# Patient Record
Sex: Male | Born: 1980 | Race: Black or African American | Hispanic: No | Marital: Single | Smoking: Current every day smoker
Health system: Southern US, Community
[De-identification: ages and names within clinical notes are randomized; demographics above are authoritative.]

## PROBLEM LIST (undated history)

## (undated) DIAGNOSIS — F32A Depression, unspecified: Secondary | ICD-10-CM

## (undated) DIAGNOSIS — F319 Bipolar disorder, unspecified: Secondary | ICD-10-CM

## (undated) DIAGNOSIS — R569 Unspecified convulsions: Secondary | ICD-10-CM

## (undated) DIAGNOSIS — F209 Schizophrenia, unspecified: Secondary | ICD-10-CM

## (undated) DIAGNOSIS — F329 Major depressive disorder, single episode, unspecified: Secondary | ICD-10-CM

## (undated) DIAGNOSIS — I10 Essential (primary) hypertension: Secondary | ICD-10-CM

---
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
## 2004-04-09 ENCOUNTER — Emergency Department (HOSPITAL_COMMUNITY): Admission: EM | Admit: 2004-04-09 | Discharge: 2004-04-09 | Payer: Self-pay | Admitting: Family Medicine

## 2006-04-29 ENCOUNTER — Emergency Department (HOSPITAL_COMMUNITY): Admission: EM | Admit: 2006-04-29 | Discharge: 2006-04-29 | Payer: Self-pay | Admitting: Emergency Medicine

## 2006-04-30 ENCOUNTER — Emergency Department (HOSPITAL_COMMUNITY): Admission: EM | Admit: 2006-04-30 | Discharge: 2006-04-30 | Payer: Self-pay | Admitting: Emergency Medicine

## 2006-10-02 ENCOUNTER — Emergency Department (HOSPITAL_COMMUNITY): Admission: EM | Admit: 2006-10-02 | Discharge: 2006-10-02 | Payer: Self-pay | Admitting: Emergency Medicine

## 2007-10-01 ENCOUNTER — Emergency Department (HOSPITAL_COMMUNITY): Admission: EM | Admit: 2007-10-01 | Discharge: 2007-10-01 | Payer: Self-pay | Admitting: Emergency Medicine

## 2008-10-28 ENCOUNTER — Emergency Department (HOSPITAL_COMMUNITY): Admission: EM | Admit: 2008-10-28 | Discharge: 2008-10-28 | Payer: Self-pay | Admitting: Emergency Medicine

## 2008-11-26 ENCOUNTER — Emergency Department (HOSPITAL_COMMUNITY): Admission: EM | Admit: 2008-11-26 | Discharge: 2008-11-26 | Payer: Self-pay | Admitting: Emergency Medicine

## 2009-06-02 ENCOUNTER — Emergency Department (HOSPITAL_COMMUNITY): Admission: EM | Admit: 2009-06-02 | Discharge: 2009-06-02 | Payer: Self-pay | Admitting: Family Medicine

## 2009-08-05 ENCOUNTER — Emergency Department (HOSPITAL_COMMUNITY): Admission: EM | Admit: 2009-08-05 | Discharge: 2009-08-05 | Payer: Self-pay | Admitting: Emergency Medicine

## 2010-04-17 LAB — CBC
HCT: 38.5 % — ABNORMAL LOW (ref 39.0–52.0)
Hemoglobin: 13.3 g/dL (ref 13.0–17.0)
MCHC: 34.5 g/dL (ref 30.0–36.0)
MCV: 88.3 fL (ref 78.0–100.0)
RDW: 11.9 % (ref 11.5–15.5)

## 2010-04-17 LAB — DIFFERENTIAL
Basophils Absolute: 0.1 10*3/uL (ref 0.0–0.1)
Basophils Relative: 1 % (ref 0–1)
Eosinophils Absolute: 0.1 10*3/uL (ref 0.0–0.7)
Eosinophils Relative: 2 % (ref 0–5)
Monocytes Absolute: 0.4 10*3/uL (ref 0.1–1.0)
Neutro Abs: 3.5 10*3/uL (ref 1.7–7.7)

## 2010-04-17 LAB — URINALYSIS, ROUTINE W REFLEX MICROSCOPIC
Bilirubin Urine: NEGATIVE
Glucose, UA: NEGATIVE mg/dL
Hgb urine dipstick: NEGATIVE
Nitrite: NEGATIVE
Protein, ur: NEGATIVE mg/dL
Specific Gravity, Urine: 1.025 (ref 1.005–1.030)
Urobilinogen, UA: 0.2 mg/dL (ref 0.0–1.0)
pH: 6.5 (ref 5.0–8.0)

## 2010-04-17 LAB — RAPID URINE DRUG SCREEN, HOSP PERFORMED
Amphetamines: NOT DETECTED
Barbiturates: NOT DETECTED
Benzodiazepines: NOT DETECTED
Cocaine: NOT DETECTED
Opiates: NOT DETECTED
Tetrahydrocannabinol: POSITIVE — AB

## 2010-04-17 LAB — BASIC METABOLIC PANEL
CO2: 25 mEq/L (ref 19–32)
Calcium: 8.9 mg/dL (ref 8.4–10.5)
Chloride: 105 mEq/L (ref 96–112)
Glucose, Bld: 95 mg/dL (ref 70–99)
Sodium: 137 mEq/L (ref 135–145)

## 2010-04-17 LAB — URINE MICROSCOPIC-ADD ON

## 2010-04-18 LAB — RAPID URINE DRUG SCREEN, HOSP PERFORMED: Tetrahydrocannabinol: POSITIVE — AB

## 2010-10-14 LAB — CBC
HCT: 38.8 — ABNORMAL LOW
Hemoglobin: 12.7 — ABNORMAL LOW
MCV: 86.6
Platelets: 172
RDW: 14.1

## 2010-10-14 LAB — RAPID URINE DRUG SCREEN, HOSP PERFORMED
Amphetamines: NOT DETECTED
Barbiturates: NOT DETECTED
Benzodiazepines: NOT DETECTED
Cocaine: NOT DETECTED
Opiates: NOT DETECTED

## 2010-10-14 LAB — COMPREHENSIVE METABOLIC PANEL
Albumin: 4
Alkaline Phosphatase: 56
BUN: 13
Chloride: 106
Creatinine, Ser: 0.9
Glucose, Bld: 87
Potassium: 3.6
Total Bilirubin: 0.8
Total Protein: 6.4

## 2010-10-14 LAB — DIFFERENTIAL
Basophils Absolute: 0
Basophils Relative: 1
Lymphocytes Relative: 36
Monocytes Absolute: 0.4
Neutro Abs: 3.2
Neutrophils Relative %: 55

## 2010-10-14 LAB — POCT I-STAT, CHEM 8
BUN: 15
Calcium, Ion: 1.16
Chloride: 106
HCT: 40
Potassium: 3.6
Sodium: 140

## 2010-10-14 LAB — URINALYSIS, ROUTINE W REFLEX MICROSCOPIC
Bilirubin Urine: NEGATIVE
Glucose, UA: NEGATIVE
Hgb urine dipstick: NEGATIVE
Protein, ur: NEGATIVE
Urobilinogen, UA: 0.2

## 2010-11-24 ENCOUNTER — Emergency Department (HOSPITAL_COMMUNITY)
Admission: EM | Admit: 2010-11-24 | Discharge: 2010-11-25 | Disposition: A | Discharge status: Home / Self Care | Payer: Medicaid Other | Attending: Emergency Medicine | Admitting: Emergency Medicine

## 2010-11-24 ENCOUNTER — Emergency Department (HOSPITAL_COMMUNITY)
Admission: EM | Admit: 2010-11-24 | Discharge: 2010-11-24 | Discharge status: Against Medical Advice | Payer: Self-pay | Attending: Emergency Medicine | Admitting: Emergency Medicine

## 2010-11-24 ENCOUNTER — Encounter: Payer: Self-pay | Admitting: *Deleted

## 2010-11-24 DIAGNOSIS — F209 Schizophrenia, unspecified: Secondary | ICD-10-CM | POA: Insufficient documentation

## 2010-11-24 DIAGNOSIS — Z79899 Other long term (current) drug therapy: Secondary | ICD-10-CM | POA: Insufficient documentation

## 2010-11-24 DIAGNOSIS — F17200 Nicotine dependence, unspecified, uncomplicated: Secondary | ICD-10-CM | POA: Insufficient documentation

## 2010-11-24 DIAGNOSIS — Z0389 Encounter for observation for other suspected diseases and conditions ruled out: Secondary | ICD-10-CM | POA: Insufficient documentation

## 2010-11-24 DIAGNOSIS — R112 Nausea with vomiting, unspecified: Secondary | ICD-10-CM

## 2010-11-24 DIAGNOSIS — R109 Unspecified abdominal pain: Secondary | ICD-10-CM | POA: Insufficient documentation

## 2010-11-24 HISTORY — DX: Unspecified convulsions: R56.9

## 2010-11-24 NOTE — ED Notes (Signed)
Pt arrives via EMS c/o nausea, vomiting, abdominal pain, chills and fever. No fever at this time. Patient states that he believes he has roach in his ear because he saw a roach crawling in his bed Friday night. Nothing visualized in right ear at this time. He also states that he has not been able to hold anything on his stomach for 2 days. Patient complaining of epigastric pain, patient states that he believes that he ate maggots by mistake approximately 1 month ago. Patients pain is a aching pain 10/10. States pain is "all over my body." Patient states that he has vomited 7 x today and 3 x yesterday. Patient states that the vomit is the same. Small amount (50ml) of vomit in pt emesis bag. Patient states that he has also had to use the restroom more frequently than normal as well. Unable to tell hox much more frequently.Patient states that he is having sensitivity to light as well.

## 2010-11-25 ENCOUNTER — Encounter (HOSPITAL_COMMUNITY): Payer: Self-pay | Admitting: Emergency Medicine

## 2010-11-25 LAB — POCT I-STAT, CHEM 8
BUN: 12 mg/dL (ref 6–23)
Calcium, Ion: 1.16 mmol/L (ref 1.12–1.32)
Chloride: 104 mEq/L (ref 96–112)
HCT: 42 % (ref 39.0–52.0)
Potassium: 3.6 mEq/L (ref 3.5–5.1)
Sodium: 140 mEq/L (ref 135–145)

## 2010-11-25 LAB — OCCULT BLOOD, POC DEVICE: Fecal Occult Bld: NEGATIVE

## 2010-11-25 LAB — LITHIUM LEVEL: Lithium Lvl: <0.25 mEq/L — ABNORMAL LOW (ref 0.80–1.40)

## 2010-11-25 MED ORDER — ONDANSETRON HCL 4 MG PO TABS: 4.0000 mg | ORAL_TABLET | Freq: Four times a day (QID) | ORAL | Status: AC

## 2010-11-25 MED ORDER — PANTOPRAZOLE SODIUM 40 MG PO TBEC
40.0000 mg | DELAYED_RELEASE_TABLET | Freq: Once | ORAL | Status: AC
  Administered 2010-11-25: 40 mg via ORAL

## 2010-11-25 MED ORDER — ONDANSETRON 8 MG PO TBDP
8.0000 mg | ORAL_TABLET | Freq: Once | ORAL | Status: AC
  Administered 2010-11-25: 8 mg via ORAL

## 2010-11-25 NOTE — ED Provider Notes (Signed)
History     CSN: [id number] Arrival date & time: 11/24/2010  9:38 PM   First MD Initiated Contact with Patient 11/24/10 2139      Chief Complaint  Patient presents with  . Nausea   HPI History provided by the patient. Patient presents with complaints of nausea and vomiting that began yesterday. Reports also having blood in vomit at times.  He has not been able to eat much and feels nausea with drinking fluids.  Pt states he has hx of schizophrenia for which he takes lithium. Patient denies change in dosage has been taking as prescribed. Denies depression, SI/HI, paranoia, auditory or visual hallucinations. Patient denies any alcohol or drug use.    Past Medical History  Diagnosis Date  . Seizures     History reviewed. No pertinent past surgical history.  History reviewed. No pertinent family history.  History  Substance Use Topics  . Smoking status: Current Everyday Smoker -- 1.0 packs/day for 5 years    Types: Cigarettes  . Smokeless tobacco: Not on file  . Alcohol Use: No      Review of Systems  Constitutional: Negative for fever and chills.  Gastrointestinal: Positive for nausea, vomiting and abdominal pain.  Psychiatric/Behavioral: Negative for suicidal ideas and hallucinations. The patient is not nervous/anxious.   All other systems reviewed and are negative.    Allergies  Review of patient's allergies indicates no known allergies.  Home Medications   Current Outpatient Rx  Name Route Sig Dispense Refill  . LITHIUM CARBONATE 150 MG PO CAPS Oral Take 150 mg by mouth 3 (three) times daily with meals.        BP 119/16  Pulse 77  Temp(Src) 98.5 F (36.9 C) (Oral)  Resp 18  Ht 5\' 9"  (1.753 m)  Wt 155 lb (70.308 kg)  BMI 22.89 kg/m2  SpO2 100%  Physical Exam  Nursing note and vitals reviewed. Constitutional: He is oriented to person, place, and time. He appears well-developed and well-nourished. No distress.  HENT:  Head: Normocephalic and  atraumatic.  Eyes: Conjunctivae are normal.  Cardiovascular: Normal rate.   No murmur heard. Pulmonary/Chest: Effort normal and breath sounds normal. He has no wheezes. He has no rales.  Abdominal: Soft. Bowel sounds are normal. There is no tenderness. There is no rebound and no guarding.  Genitourinary: Rectum normal. Rectal exam shows no tenderness. Guaiac negative stool.  Neurological: He is alert and oriented to person, place, and time.  Skin: Skin is warm.  Psychiatric: He has a normal mood and affect. His behavior is normal. Judgment and thought content normal.    ED Course  Procedures (including critical care time)   Labs Reviewed  POCT I-STAT, CHEM 8  OCCULT BLOOD, POC DEVICE  LITHIUM LEVEL  I-STAT, CHEM 8  OCCULT BLOOD X 1 CARD TO LAB, STOOL   Results for orders placed during the hospital encounter of 11/24/10  POCT I-STAT, CHEM 8      Component Value Range   Sodium 140  135 - 145 (mEq/L)   Potassium 3.6  3.5 - 5.1 (mEq/L)   Chloride 104  96 - 112 (mEq/L)   BUN 12  6 - 23 (mg/dL)   Creatinine, Ser 4.09  0.50 - 1.35 (mg/dL)   Glucose, Bld 85  70 - 99 (mg/dL)   Calcium, Ion 8.11  9.14 - 1.32 (mmol/L)   TCO2 23  0 - 100 (mmol/L)   Hemoglobin 14.3  13.0 - 17.0 (g/dL)   HCT 42.0  39.0 - 52.0 (%)  OCCULT BLOOD, POC DEVICE      Component Value Range   Fecal Occult Bld NEGATIVE       1. Nausea & vomiting       MDM  Patient seen and evaluated. Patient in no acute distress. She was soft nontender abdomen.  Patient feeling better after medications and requesting something to eat and drink. Will start with by mouth fluid trial.  Tolerating by mouth fluids and food at this time. Will discharge home. Patient was notified that his lithium level is low and to continue his medications as prescribed.      Angus Seller, [place] 11/26/10 [phone]

## 2010-11-25 NOTE — Discharge Instructions (Signed)
Please continue to take her lithium as prescribed. He is followup to primary care provider and psychiatrist.  Nausea and Vomiting Nausea means you feel sick to your stomach. Throwing up (vomiting) is a reflex where stomach contents come out of your mouth. HOME CARE   Take medicine as told by your doctor.   Do not force yourself to eat. However, you do need to drink fluids.   If you feel like eating, eat a normal diet as told by your doctor.   Eat rice, wheat, potatoes, bread, lean meats, yogurt, fruits, and vegetables.   Avoid high-fat foods.   Drink enough fluids to keep your pee (urine) clear or pale yellow.   Ask your doctor how to replace body fluid losses (rehydrate). Signs of body fluid loss (dehydration) include:   Feeling very thirsty.   Dry lips and mouth.   Feeling dizzy.   Dark pee.   Peeing less than normal.   Feeling confused.   Fast breathing or heart rate.  GET HELP RIGHT AWAY IF:   You have blood in your throw up.   You have black or bloody poop (stool).   You have a bad headache or stiff neck.   You feel confused.   You have bad belly (abdominal) pain.   You have chest pain or trouble breathing.   You do not pee at least once every 8 hours.   You have cold, clammy skin.   You keep throwing up after 24 to 48 hours.   You have a fever.  MAKE SURE YOU:   Understand these instructions.   Will watch your condition.   Will get help right away if you are not doing well or get worse.  Document Released: 06/18/2007 Document Revised: 09/11/2010 Document Reviewed: 05/31/2010 [facility] Patient Information 2012 [place], [place].

## 2010-11-27 NOTE — ED Provider Notes (Signed)
Medical screening examination/treatment/procedure(s) were performed by non-physician practitioner and as supervising physician I was immediately available for consultation/collaboration.  Flint Melter, MD 11/27/10 [phone]

## 2011-07-06 ENCOUNTER — Emergency Department (HOSPITAL_COMMUNITY)
Admission: EM | Admit: 2011-07-06 | Discharge: 2011-07-06 | Disposition: A | Discharge status: Home / Self Care | Payer: Medicaid Other | Attending: Emergency Medicine | Admitting: Emergency Medicine

## 2011-07-06 ENCOUNTER — Emergency Department (HOSPITAL_COMMUNITY): Payer: Medicaid Other

## 2011-07-06 ENCOUNTER — Encounter (HOSPITAL_COMMUNITY): Payer: Self-pay | Admitting: Emergency Medicine

## 2011-07-06 DIAGNOSIS — J069 Acute upper respiratory infection, unspecified: Secondary | ICD-10-CM | POA: Insufficient documentation

## 2011-07-06 DIAGNOSIS — R569 Unspecified convulsions: Secondary | ICD-10-CM | POA: Insufficient documentation

## 2011-07-06 DIAGNOSIS — R05 Cough: Secondary | ICD-10-CM | POA: Insufficient documentation

## 2011-07-06 DIAGNOSIS — B349 Viral infection, unspecified: Secondary | ICD-10-CM

## 2011-07-06 DIAGNOSIS — B9789 Other viral agents as the cause of diseases classified elsewhere: Secondary | ICD-10-CM | POA: Insufficient documentation

## 2011-07-06 DIAGNOSIS — R112 Nausea with vomiting, unspecified: Secondary | ICD-10-CM | POA: Insufficient documentation

## 2011-07-06 DIAGNOSIS — F17200 Nicotine dependence, unspecified, uncomplicated: Secondary | ICD-10-CM | POA: Insufficient documentation

## 2011-07-06 DIAGNOSIS — J029 Acute pharyngitis, unspecified: Secondary | ICD-10-CM | POA: Insufficient documentation

## 2011-07-06 DIAGNOSIS — IMO0001 Reserved for inherently not codable concepts without codable children: Secondary | ICD-10-CM | POA: Insufficient documentation

## 2011-07-06 DIAGNOSIS — R059 Cough, unspecified: Secondary | ICD-10-CM | POA: Insufficient documentation

## 2011-07-06 DIAGNOSIS — R079 Chest pain, unspecified: Secondary | ICD-10-CM | POA: Insufficient documentation

## 2011-07-06 DIAGNOSIS — R509 Fever, unspecified: Secondary | ICD-10-CM | POA: Insufficient documentation

## 2011-07-06 LAB — RAPID STREP SCREEN (MED CTR MEBANE ONLY): Streptococcus, Group A Screen (Direct): NEGATIVE

## 2011-07-06 MED ORDER — ACETAMINOPHEN 325 MG PO TABS
650.0000 mg | ORAL_TABLET | Freq: Once | ORAL | Status: AC
  Administered 2011-07-06: 650 mg via ORAL
  Filled 2011-07-06: qty 2

## 2011-07-06 MED ORDER — KETOROLAC TROMETHAMINE 60 MG/2ML IM SOLN
60.0000 mg | Freq: Once | INTRAMUSCULAR | Status: AC
  Administered 2011-07-06: 60 mg via INTRAMUSCULAR
  Filled 2011-07-06: qty 2

## 2011-07-06 NOTE — ED Notes (Signed)
Patient transported to X-ray 

## 2011-07-06 NOTE — ED Provider Notes (Signed)
History  This chart was scribed for Erik Numbers, MD by Bennett Scrape. This patient was seen in room TR05C/TR05C and the patient's care was started at 4:49PM.  CSN: [id number]  Arrival date & time 07/06/11  1616   First MD Initiated Contact with Patient 07/06/11 1649      Chief Complaint  Patient presents with  . Sore Throat  . Cough  . Chest Pain    The history is provided by the patient. No language interpreter was used.    Erik Vargas is a 31 y.o. male who presents to the Emergency Department complaining of one week of gradual onset, gradually worsening, constant productive cough of mucus with associated  chest tightness, myalgias, nausea, emesis, chills and fever of 101.8. The symptoms are better with rest and aggravated by exertion. He reports taking ibuprofen at home with mild improvement in his symptoms. His last dose of ibuprofen was last night. He has not seen a MD for the symptoms prior to today. He reports that his Grandfather was recently diagnosed with PNA. He denies urinary symptoms, diarrhea and abdominal pain as associated symptoms. He has a h/o seizures. He is a current everyday smoker but denies alcohol use.  Past Medical History  Diagnosis Date  . Seizures     History reviewed. No pertinent past surgical history.  No family history on file.  History  Substance Use Topics  . Smoking status: Current Everyday Smoker -- 1.0 packs/day for 5 years    Types: Cigarettes  . Smokeless tobacco: Not on file  . Alcohol Use: No      Review of Systems  Constitutional: Positive for fever and chills.  HENT: Positive for sore throat. Negative for neck pain.   Respiratory: Positive for cough and chest tightness. Negative for shortness of breath.   Gastrointestinal: Positive for nausea and vomiting. Negative for abdominal pain.  Genitourinary: Negative for dysuria and hematuria.  All other systems reviewed and are negative.    Allergies  Review of patient's  allergies indicates no known allergies.  Home Medications   Current Outpatient Rx  Name Route Sig Dispense Refill  . PRESCRIPTION MEDICATION Intramuscular Inject 1 application into the muscle once a week. Lithium injection      Triage Vitals: BP 128/74  Pulse 82  Temp 101.8 F (38.8 C) (Oral)  Resp 14  SpO2 100%  Physical Exam  Nursing note and vitals reviewed.  GEN: Well-developed, well-nourished male in no distress HEENT: Atraumatic, normocephalic. Oropharynx is erythematous without exudate EYES: PERRLA BL, no scleral icterus. NECK: Trachea midline, no meningismus CV: regular rate and rhythm. No murmurs, rubs, or gallops PULM: No respiratory distress.  No crackles, wheezes, or rales. Neuro: A and O x 3 MSK: Patient moves all 4 extremities symmetrically, no deformity, edema, or injury noted Skin: No rashes petechiae, purpura, or jaundice Psych: no abnormality of mood  ED Course  Procedures (including critical care time)  DIAGNOSTIC STUDIES: Oxygen Saturation is 100% on room air, normal by my interpretation.    COORDINATION OF CARE: 5:18PM-Informed pt of negative strep test. Discussed treatment plan which includes chest x-ray with pt and pt agreed to plan. Pt turned down nausea medications.  6:13PM-Pt rechecked and feels better. Informed pt of negative radiology report. Discussed discharge plan with pt and pt agreed to plan.   Labs Reviewed  RAPID STREP SCREEN   Dg Chest 2 View  07/06/2011  *RADIOLOGY REPORT*  Clinical Data: Chest pain.  CHEST - 2 VIEW  Comparison:  None  Findings: The cardiac silhouette, mediastinal and hilar contours are within normal limits.  The lungs are clear.  No pleural effusion.  The bony thorax is intact.  IMPRESSION: No acute cardiopulmonary findings.  Original Report Authenticated By: P. Loralie Champagne, M.D.     1. URI (upper respiratory infection)   2. Viral syndrome       MDM  Patient was evaluated by myself. Prior to my exam  patient had had a rapid strep screen sent. This was negative. Patient had erythema without exudate on my exam. He did complain of cough and was febrile here with recent exposure to a family member with pneumonia. Patient reported symptoms x1 week. He was otherwise hemodynamically stable. Given his myalgias he was given a shot of Toradol IM. Chest x-ray was performed and showed no pneumonia. Patient denied nausea here. Following therapy patient had vital signs within normal limits. He was discharged home and advised to use over-the-counter medications for symptom control. He was also encouraged to drink plenty of fluids. He can followup with her regular Dr. as needed.      I personally performed the services described in this documentation, which was scribed in my presence. The recorded information has been reviewed and considered.      Erik Numbers, MD 07/06/11 [phone]

## 2011-07-06 NOTE — ED Notes (Addendum)
C/o sore throat, chills, and non-productive cough x 1 week.  Reports chest pain since Thursday.

## 2011-07-06 NOTE — Discharge Instructions (Signed)
Upper Respiratory Infection, Adult An upper respiratory infection (URI) is also sometimes known as the common cold. The upper respiratory tract includes the nose, sinuses, throat, trachea, and bronchi. Bronchi are the airways leading to the lungs. Most people improve within 1 week, but symptoms can last up to 2 weeks. A residual cough may last even longer.  CAUSES Many different viruses can infect the tissues lining the upper respiratory tract. The tissues become irritated and inflamed and often become very moist. Mucus production is also common. A cold is contagious. You can easily spread the virus to others by oral contact. This includes kissing, sharing a glass, coughing, or sneezing. Touching your mouth or nose and then touching a surface, which is then touched by another person, can also spread the virus. SYMPTOMS  Symptoms typically develop 1 to 3 days after you come in contact with a cold virus. Symptoms vary from person to person. They may include:  Runny nose.   Sneezing.   Nasal congestion.   Sinus irritation.   Sore throat.   Loss of voice (laryngitis).   Cough.   Fatigue.   Muscle aches.   Loss of appetite.   Headache.   Low-grade fever.  DIAGNOSIS  You might diagnose your own cold based on familiar symptoms, since most people get a cold 2 to 3 times a year. Your caregiver can confirm this based on your exam. Most importantly, your caregiver can check that your symptoms are not due to another disease such as strep throat, sinusitis, pneumonia, asthma, or epiglottitis. Blood tests, throat tests, and X-rays are not necessary to diagnose a common cold, but they may sometimes be helpful in excluding other more serious diseases. Your caregiver will decide if any further tests are required. RISKS AND COMPLICATIONS  You may be at risk for a more severe case of the common cold if you smoke cigarettes, have chronic heart disease (such as heart failure) or lung disease (such as  asthma), or if you have a weakened immune system. The very young and very old are also at risk for more serious infections. Bacterial sinusitis, middle ear infections, and bacterial pneumonia can complicate the common cold. The common cold can worsen asthma and chronic obstructive pulmonary disease (COPD). Sometimes, these complications can require emergency medical care and may be life-threatening. PREVENTION  The best way to protect against getting a cold is to practice good hygiene. Avoid oral or hand contact with people with cold symptoms. Wash your hands often if contact occurs. There is no clear evidence that vitamin C, vitamin E, echinacea, or exercise reduces the chance of developing a cold. However, it is always recommended to get plenty of rest and practice good nutrition. TREATMENT  Treatment is directed at relieving symptoms. There is no cure. Antibiotics are not effective, because the infection is caused by a virus, not by bacteria. Treatment may include:  Increased fluid intake. Sports drinks offer valuable electrolytes, sugars, and fluids.   Breathing heated mist or steam (vaporizer or shower).   Eating chicken soup or other clear broths, and maintaining good nutrition.   Getting plenty of rest.   Using gargles or lozenges for comfort.   Controlling fevers with ibuprofen or acetaminophen as directed by your caregiver.   Increasing usage of your inhaler if you have asthma.  Zinc gel and zinc lozenges, taken in the first 24 hours of the common cold, can shorten the duration and lessen the severity of symptoms. Pain medicines may help with fever, muscle   aches, and throat pain. A variety of non-prescription medicines are available to treat congestion and runny nose. Your caregiver can make recommendations and may suggest nasal or lung inhalers for other symptoms.  HOME CARE INSTRUCTIONS   Only take over-the-counter or prescription medicines for pain, discomfort, or fever as directed  by your caregiver.   Use a warm mist humidifier or inhale steam from a shower to increase air moisture. This may keep secretions moist and make it easier to breathe.   Drink enough water and fluids to keep your urine clear or pale yellow.   Rest as needed.   Return to work when your temperature has returned to normal or as your caregiver advises. You may need to stay home longer to avoid infecting others. You can also use a face mask and careful hand washing to prevent spread of the virus.  SEEK MEDICAL CARE IF:   After the first few days, you feel you are getting worse rather than better.   You need your caregiver's advice about medicines to control symptoms.   You develop chills, worsening shortness of breath, or brown or red sputum. These may be signs of pneumonia.   You develop yellow or brown nasal discharge or pain in the face, especially when you bend forward. These may be signs of sinusitis.   You develop a fever, swollen neck glands, pain with swallowing, or white areas in the back of your throat. These may be signs of strep throat.  SEEK IMMEDIATE MEDICAL CARE IF:   You have a fever.   You develop severe or persistent headache, ear pain, sinus pain, or chest pain.   You develop wheezing, a prolonged cough, cough up blood, or have a change in your usual mucus (if you have chronic lung disease).   You develop sore muscles or a stiff neck.  Document Released: 06/25/2000 Document Revised: 12/19/2010 Document Reviewed: 05/03/2010 [facility] Patient Information 2012 [place], [place].Viral Infections A viral infection can be caused by different types of viruses.Most viral infections are not serious and resolve on their own. However, some infections may cause severe symptoms and may lead to further complications. SYMPTOMS Viruses can frequently cause:  Minor sore throat.   Aches and pains.   Headaches.   Runny nose.   Different types of rashes.   Watery eyes.    Tiredness.   Cough.   Loss of appetite.   Gastrointestinal infections, resulting in nausea, vomiting, and diarrhea.  These symptoms do not respond to antibiotics because the infection is not caused by bacteria. However, you might catch a bacterial infection following the viral infection. This is sometimes called a "superinfection." Symptoms of such a bacterial infection may include:  Worsening sore throat with pus and difficulty swallowing.   Swollen neck glands.   Chills and a high or persistent fever.   Severe headache.   Tenderness over the sinuses.   Persistent overall ill feeling (malaise), muscle aches, and tiredness (fatigue).   Persistent cough.   Yellow, green, or brown mucus production with coughing.  HOME CARE INSTRUCTIONS   Only take over-the-counter or prescription medicines for pain, discomfort, diarrhea, or fever as directed by your caregiver.   Drink enough water and fluids to keep your urine clear or pale yellow. Sports drinks can provide valuable electrolytes, sugars, and hydration.   Get plenty of rest and maintain proper nutrition. Soups and broths with crackers or rice are fine.  SEEK IMMEDIATE MEDICAL CARE IF:   You have severe headaches, shortness of  breath, chest pain, neck pain, or an unusual rash.   You have uncontrolled vomiting, diarrhea, or you are unable to keep down fluids.   You or your child has an oral temperature above 102 F (38.9 C), not controlled by medicine.   Your baby is older than 3 months with a rectal temperature of 102 F (38.9 C) or higher.   Your baby is 60 months old or younger with a rectal temperature of 100.4 F (38 C) or higher.  MAKE SURE YOU:   Understand these instructions.   Will watch your condition.   Will get help right away if you are not doing well or get worse.  Document Released: 10/09/2004 Document Revised: 12/19/2010 Document Reviewed: 05/06/2010 [facility] Patient Information 2012 [place],  [place].

## 2012-02-01 ENCOUNTER — Emergency Department (HOSPITAL_COMMUNITY)
Admission: EM | Admit: 2012-02-01 | Discharge: 2012-02-03 | Disposition: A | Discharge status: Home / Self Care | Payer: Medicaid Other | Attending: Emergency Medicine | Admitting: Emergency Medicine

## 2012-02-01 ENCOUNTER — Encounter (HOSPITAL_COMMUNITY): Payer: Self-pay | Admitting: Emergency Medicine

## 2012-02-01 DIAGNOSIS — G40909 Epilepsy, unspecified, not intractable, without status epilepticus: Secondary | ICD-10-CM | POA: Insufficient documentation

## 2012-02-01 DIAGNOSIS — F3289 Other specified depressive episodes: Secondary | ICD-10-CM | POA: Insufficient documentation

## 2012-02-01 DIAGNOSIS — F259 Schizoaffective disorder, unspecified: Secondary | ICD-10-CM

## 2012-02-01 DIAGNOSIS — R45851 Suicidal ideations: Secondary | ICD-10-CM

## 2012-02-01 DIAGNOSIS — F209 Schizophrenia, unspecified: Secondary | ICD-10-CM | POA: Insufficient documentation

## 2012-02-01 DIAGNOSIS — Z79899 Other long term (current) drug therapy: Secondary | ICD-10-CM | POA: Insufficient documentation

## 2012-02-01 DIAGNOSIS — R110 Nausea: Secondary | ICD-10-CM | POA: Insufficient documentation

## 2012-02-01 DIAGNOSIS — F329 Major depressive disorder, single episode, unspecified: Secondary | ICD-10-CM | POA: Insufficient documentation

## 2012-02-01 DIAGNOSIS — F17200 Nicotine dependence, unspecified, uncomplicated: Secondary | ICD-10-CM | POA: Insufficient documentation

## 2012-02-01 HISTORY — DX: Depression, unspecified: F32.A

## 2012-02-01 HISTORY — DX: Major depressive disorder, single episode, unspecified: F32.9

## 2012-02-01 HISTORY — DX: Schizophrenia, unspecified: F20.9

## 2012-02-01 LAB — RAPID URINE DRUG SCREEN, HOSP PERFORMED
Amphetamines: NOT DETECTED
Barbiturates: NOT DETECTED
Benzodiazepines: NOT DETECTED
Cocaine: NOT DETECTED

## 2012-02-01 LAB — COMPREHENSIVE METABOLIC PANEL
ALT: 10 U/L (ref 0–53)
Albumin: 4.1 g/dL (ref 3.5–5.2)
Alkaline Phosphatase: 74 U/L (ref 39–117)
Calcium: 9.2 mg/dL (ref 8.4–10.5)
Potassium: 3.3 mEq/L — ABNORMAL LOW (ref 3.5–5.1)
Sodium: 138 mEq/L (ref 135–145)
Total Protein: 7.2 g/dL (ref 6.0–8.3)

## 2012-02-01 LAB — CBC
MCHC: 32.8 g/dL (ref 30.0–36.0)
Platelets: 174 10*3/uL (ref 150–400)
RDW: 13.2 % (ref 11.5–15.5)

## 2012-02-01 MED ORDER — LORAZEPAM 1 MG PO TABS: 1.0000 mg | ORAL_TABLET | Freq: Three times a day (TID) | ORAL | Status: DC | PRN

## 2012-02-01 MED ORDER — BENZTROPINE MESYLATE 1 MG/ML IJ SOLN: 2.0000 mg | Freq: Two times a day (BID) | INTRAMUSCULAR | Status: DC | PRN

## 2012-02-01 MED ORDER — LORAZEPAM 1 MG PO TABS
2.0000 mg | ORAL_TABLET | Freq: Four times a day (QID) | ORAL | Status: DC | PRN
  Administered 2012-02-03: 2 mg via ORAL
  Filled 2012-02-01 (×2): qty 2

## 2012-02-01 MED ORDER — HALOPERIDOL LACTATE 5 MG/ML IJ SOLN
10.0000 mg | Freq: Once | INTRAMUSCULAR | Status: AC
  Administered 2012-02-01: 10 mg via INTRAMUSCULAR

## 2012-02-01 MED ORDER — IBUPROFEN 600 MG PO TABS: 600.0000 mg | ORAL_TABLET | Freq: Three times a day (TID) | ORAL | Status: DC | PRN

## 2012-02-01 MED ORDER — HALOPERIDOL LACTATE 5 MG/ML IJ SOLN
5.0000 mg | Freq: Three times a day (TID) | INTRAMUSCULAR | Status: DC | PRN
  Filled 2012-02-01: qty 1

## 2012-02-01 MED ORDER — ONDANSETRON 4 MG PO TBDP
8.0000 mg | ORAL_TABLET | Freq: Once | ORAL | Status: AC
  Administered 2012-02-01: 8 mg via ORAL
  Filled 2012-02-01 (×2): qty 1

## 2012-02-01 MED ORDER — ACETAMINOPHEN 325 MG PO TABS: 650.0000 mg | ORAL_TABLET | ORAL | Status: DC | PRN

## 2012-02-01 MED ORDER — LORAZEPAM 2 MG/ML IJ SOLN
2.0000 mg | Freq: Four times a day (QID) | INTRAMUSCULAR | Status: DC | PRN
  Filled 2012-02-01: qty 1

## 2012-02-01 MED ORDER — LORAZEPAM 2 MG/ML IJ SOLN: 2.0000 mg | Freq: Once | INTRAMUSCULAR | Status: AC

## 2012-02-01 MED ORDER — DIPHENHYDRAMINE HCL 25 MG PO CAPS: 50.0000 mg | ORAL_CAPSULE | Freq: Every evening | ORAL | Status: DC | PRN

## 2012-02-01 MED ORDER — LORAZEPAM 2 MG/ML IJ SOLN
INTRAMUSCULAR | Status: AC
  Administered 2012-02-01: 2 mg via INTRAMUSCULAR
  Filled 2012-02-01: qty 1

## 2012-02-01 MED ORDER — CLONAZEPAM 1 MG PO TABS
2.0000 mg | ORAL_TABLET | Freq: Two times a day (BID) | ORAL | Status: DC
  Administered 2012-02-01 – 2012-02-02 (×2): 2 mg via ORAL
  Filled 2012-02-01 (×2): qty 2

## 2012-02-01 MED ORDER — HALOPERIDOL 2 MG PO TABS
2.0000 mg | ORAL_TABLET | Freq: Two times a day (BID) | ORAL | Status: DC
  Administered 2012-02-01 – 2012-02-02 (×2): 2 mg via ORAL
  Filled 2012-02-01 (×3): qty 1

## 2012-02-01 MED ORDER — HALOPERIDOL LACTATE 5 MG/ML IJ SOLN
INTRAMUSCULAR | Status: AC
  Filled 2012-02-01: qty 2

## 2012-02-01 MED ORDER — DIVALPROEX SODIUM 500 MG PO DR TAB
500.0000 mg | DELAYED_RELEASE_TABLET | Freq: Two times a day (BID) | ORAL | Status: DC
  Filled 2012-02-01 (×2): qty 1

## 2012-02-01 MED ORDER — ONDANSETRON HCL 4 MG PO TABS: 4.0000 mg | ORAL_TABLET | Freq: Three times a day (TID) | ORAL | Status: DC | PRN

## 2012-02-01 MED ORDER — POTASSIUM CHLORIDE CRYS ER 20 MEQ PO TBCR
40.0000 meq | EXTENDED_RELEASE_TABLET | Freq: Once | ORAL | Status: AC
  Administered 2012-02-01: 40 meq via ORAL
  Filled 2012-02-01: qty 2

## 2012-02-01 NOTE — ED Notes (Signed)
Up to the bathroom 

## 2012-02-01 NOTE — ED Notes (Signed)
Patients mother-Julia Andrews-[phone] (home): [phone] (cell)

## 2012-02-01 NOTE — ED Notes (Signed)
Act in w/ pt 

## 2012-02-01 NOTE — ED Notes (Signed)
Resting quietly

## 2012-02-01 NOTE — ED Notes (Signed)
Resting quietly, resp even and unlabored 

## 2012-02-01 NOTE — BH Assessment (Signed)
Assessment Note   Erik Vargas is an 32 y.o. male.   Mental Health Status:  Good eye contact, speech clear, dishelved, Ox3  only slept 1 hour in last day or so per pt, not taking his medications per father, pt reports he is consistent with medication  Current Issues:  Pt denies SI, HI, AVH, SA.  Pt is very polite and cooperative.  His words are measured. Pt admits "I am trying to be careful what and how I say things because I don't want you to get the wrong idea."    Pt reports "I had a break up with my girlfriend about 2 months ago.  I ran into her last night and after I left I got worried about her.  I don't have a phone and couldn't call her.  I hadn't slept in a day or so and just lost it.  I said some things about wanting to kill myself, but I don't want to now."    Pt verbalized understanding of his mental health disorder and confirmed medications and consistency with taking them as prescribed.    Spoke with Physician / Nurse Who treated pt in Triage in ED  "Patient reported that he had a belt around his neck last night and regrets not being able to tighten it enough to do the job."    ACT spoke with father of patient  Pt father is very concerned for his son.  Father reports pt is not consistent with taking his medications, that pt spent a year in [place] in 2010 and it helped.  Father reports patient is always talking about suicide and using drugs.    Recommendations  Pt presents well and was cordial and cooperative with ACT during assessment.  Collateral report indicates concerns to address.    Pt can benefit from Inptx and has outpatient provider.  Tele Psych ordered and psychiatrist will make recommendation.    Axis I: Schizoaffective Disorder Axis II: Deferred Axis III:  Past Medical History  Diagnosis Date  . Seizures   . Depression   . Schizophrenia    Axis IV: occupational problems, problems related to legal system/crime, problems related to social  environment and problems with primary support group Axis V: 31-40 impairment in reality testing  Past Medical History:  Past Medical History  Diagnosis Date  . Seizures   . Depression   . Schizophrenia     History reviewed. No pertinent past surgical history.  Family History: No family history on file.  Social History:  reports that he has been smoking Cigarettes.  He has a 5 pack-year smoking history. He does not have any smokeless tobacco history on file. He reports that he does not drink alcohol or use illicit drugs.  Additional Social History:  Alcohol / Drug Use Pain Medications: na Prescriptions: na Over the Counter: na Longest period of sobriety (when/how long): na  CIWA: CIWA-Ar BP: 147/82 mmHg Pulse Rate: 77  COWS:    Allergies: No Known Allergies  Home Medications:  (Not in a hospital admission)  OB/GYN Status:  No LMP for male patient.  General Assessment Data [facility] of Assessment: [facility] Living Arrangements: Non-relatives/Friends Can pt return to current living arrangement?: Yes Admission Status: Involuntary Is patient capable of signing voluntary admission?: Yes Transfer from: Acute Hospital Referral Source: MD  Education Status Is patient currently in school?: No  Risk to self Suicidal Ideation: No-Not Currently/Within Last 6 Months Suicidal Intent: No-Not Currently/Within Last 6 Months Is patient  at risk for suicide?: No Suicidal Plan?: No-Not Currently/Within Last 6 Months Access to Means: No What has been your use of drugs/alcohol within the last 12 months?: na Previous Attempts/Gestures: No How many times?: 0  Other Self Harm Risks: na Triggers for Past Attempts: None known Intentional Self Injurious Behavior: None Family Suicide History: No Recent stressful life event(s): Conflict (Comment);Loss (Comment) (girflriend break up) Persecutory voices/beliefs?: No Depression: No Substance abuse history and/or treatment for substance abuse?:  No Suicide prevention information given to non-admitted patients: Yes  Risk to Others Homicidal Ideation: No Thoughts of Harm to Others: No Current Homicidal Intent: No Current Homicidal Plan: No Access to Homicidal Means: No Identified Victim: none History of harm to others?: No Assessment of Violence: None Noted Violent Behavior Description: cooperative and polite Does patient have access to weapons?: No Criminal Charges Pending?: Yes Describe Pending Criminal Charges: disorderly conduct Does patient have a court date: Yes Court Date: 02/03/12  Psychosis Hallucinations: None noted Delusions: None noted  Mental Status Report Appear/Hygiene: Disheveled Eye Contact: Good Motor Activity: Unremarkable Speech: Logical/coherent Level of Consciousness: Alert Mood: Sad Affect: Sad Anxiety Level: Minimal Thought Processes: Coherent Judgement: Unimpaired Orientation: Person;Place;Time;Situation Obsessive Compulsive Thoughts/Behaviors: None  Cognitive Functioning Concentration: Decreased Memory: Recent Intact;Remote Intact IQ: Average Insight: Fair Impulse Control: Fair Appetite: Fair Weight Loss: 0  Weight Gain: 0  Sleep: Decreased Total Hours of Sleep: 1  Vegetative Symptoms: None  ADLScreening [facility] Assessment Services) Patient's cognitive ability adequate to safely complete daily activities?: Yes Patient able to express need for assistance with ADLs?: Yes Independently performs ADLs?: Yes (appropriate for developmental age)  Abuse/Neglect [facility]) Physical Abuse: Denies Verbal Abuse: Denies Sexual Abuse: Denies  Prior Inpatient Therapy Prior Inpatient Therapy: Yes Prior Therapy Dates: 2012 Prior Therapy Facilty/Provider(s): can't remember Reason for Treatment: schizoaffective  Prior Outpatient Therapy Prior Outpatient Therapy: Yes Prior Therapy Dates: current Prior Therapy Facilty[facility]Provider[facility]): [facility] Reason for Treatment: med mgt  ADL Screening  (condition at time of admission) Patient's cognitive ability adequate to safely complete daily activities?: Yes Patient able to express need for assistance with ADLs?: Yes Independently performs ADLs?: Yes (appropriate for developmental age) Weakness of Legs: None Weakness of Arms/Hands: None  Home Assistive Devices/Equipment Home Assistive Devices/Equipment: None  Therapy Consults (therapy consults require a physician order) PT Evaluation Needed: No OT Evalulation Needed: No SLP Evaluation Needed: No Abuse/Neglect Assessment (Assessment to be complete while patient is alone) Physical Abuse: Denies Verbal Abuse: Denies Sexual Abuse: Denies Exploitation of patient/patient's resources: Denies Self-Neglect: Denies Values / Beliefs Cultural Requests During Hospitalization: None Spiritual Requests During Hospitalization: None Consults Spiritual Care Consult Needed: No Social Work Consult Needed: No Merchant navy officer (For Healthcare) Advance Directive: Patient does not have advance directive Pre-existing out of facility DNR order (yellow form or pink MOST form): No Nutrition Screen- [facility] Adult/[facility] Have you recently lost weight without trying?: No Have you been eating poorly because of a decreased appetite?: No Malnutrition Screening Tool Score: 0   Additional Information 1:1 In Past 12 Months?: No CIRT Risk: No Elopement Risk: No Does patient have medical clearance?: Yes     Disposition:  Disposition Disposition of Patient: Referred to (Tele Psych pending) Patient referred to: Other (Comment) (Tele Psych pending)  On Site Evaluation by:   Reviewed with Physician:     Titus Mould, Eppie Gibson 02/01/2012 3:17 PM

## 2012-02-01 NOTE — ED Notes (Signed)
Became very angry, hostile after MD assessment. In hallway yelling, verbally aggressive. Security and [employer] called to assist with redirection back to his room. MD ordered Haldol and Ativan. Given without incident with security and [employer] stand by.

## 2012-02-01 NOTE — ED Notes (Signed)
Per RN, pt requesting bed to be placed back in room

## 2012-02-01 NOTE — ED Notes (Signed)
Pt in consult room pt stating that his thoughts are getting worse and he needs to talk to the doctor. Pt standing in consult room with table on his head and pt's banging his head on the chair and wall. Md [place] called and aware. Per Md [place] pt may go to psych and pt will be seen in psych ed.

## 2012-02-01 NOTE — ED Notes (Signed)
Sleeping, resp even and unlaobore

## 2012-02-01 NOTE — ED Notes (Signed)
Pt presenting to ed with c/o "I broke up with my girlfriend about 1 month ago and I just can't get back to my normal self" pt states last night I tied a belt around my neck and I was trying to kill myself. Pt states he has been off his medications x 2 days. Pt is alert and oriented and cooperative at this time

## 2012-02-01 NOTE — ED Provider Notes (Signed)
History     CSN: [id number]  Arrival date & time 02/01/12  1113   First MD Initiated Contact with Patient 02/01/12 1259      Chief Complaint  Patient presents with  . Medical Clearance    (Consider location/radiation/quality/duration/timing/severity/associated sxs/prior treatment) HPI Erik Vargas is a 32 y.o. male who is here complaining of suicidal ideations and depression. States has hz of schizophrenia. States broke up with his girlfriend a month ago, states has been feeling this way since. States stopped taking his medications. Last night put a belt on his neck, but couldn't do it. States "i dont feel normal self." Denies HI. Denies any medical problems.    Past Medical History  Diagnosis Date  . Seizures   . Depression   . Schizophrenia     History reviewed. No pertinent past surgical history.  No family history on file.  History  Substance Use Topics  . Smoking status: Current Every Day Smoker -- 1.0 packs/day for 5 years    Types: Cigarettes  . Smokeless tobacco: Not on file  . Alcohol Use: No      Review of Systems  Constitutional: Positive for activity change and appetite change. Negative for fever and chills.  HENT: Negative for neck pain and neck stiffness.   Respiratory: Negative.   Cardiovascular: Negative.   Gastrointestinal: Positive for nausea. Negative for vomiting and abdominal pain.  Skin: Negative.   Neurological: Negative for weakness and numbness.  Psychiatric/Behavioral: Positive for suicidal ideas. The patient is nervous/anxious.     Allergies  Review of patient's allergies indicates no known allergies.  Home Medications   Current Outpatient Rx  Name  Route  Sig  Dispense  Refill  . BENZTROPINE MESYLATE 1 MG PO TABS   Oral   Take 1 mg by mouth 2 (two) times daily.         Marland Kitchen CARBAMAZEPINE 200 MG PO TABS   Oral   Take 200-400 mg by mouth 2 (two) times daily. Take 1 tablet in the morning and 2 tablets at bedtime         . RISPERIDONE 3 MG PO TABS   Oral   Take 6 mg by mouth at bedtime.         Marland Kitchen RISPERIDONE MICROSPHERES 37.5 MG IM SUSR   Intramuscular   Inject 37.5 mg into the muscle every 14 (fourteen) days.           BP 147/82  Pulse 77  Temp 98.9 F (37.2 C) (Oral)  Resp 18  SpO2 100%  Physical Exam  Nursing note and vitals reviewed. Constitutional: He is oriented to person, place, and time. He appears well-developed and well-nourished. No distress.  HENT:  Head: Normocephalic.  Eyes: Conjunctivae normal are normal.  Neck: Neck supple.  Cardiovascular: Normal rate, regular rhythm and normal heart sounds.   Pulmonary/Chest: Effort normal and breath sounds normal. No respiratory distress. He has no wheezes. He has no rales.  Abdominal: Soft. Bowel sounds are normal. He exhibits no distension. There is no tenderness. There is no rebound.  Musculoskeletal: He exhibits no edema.  Neurological: He is alert and oriented to person, place, and time.  Skin: Skin is warm and dry.  Psychiatric:       Pt tearful. Has child like behavior. Avoiding eye contact    ED Course  Procedures (including critical care time)  Results for orders placed during the hospital encounter of 02/01/12  CBC      Component Value  Range   WBC 4.8  4.0 - 10.5 K/uL   RBC 5.22  4.22 - 5.81 MIL/uL   Hemoglobin 14.7  13.0 - 17.0 g/dL   HCT 27.2  53.6 - 64.4 %   MCV 85.8  78.0 - 100.0 fL   MCH 28.2  26.0 - 34.0 pg   MCHC 32.8  30.0 - 36.0 g/dL   RDW 03.4  74.2 - 59.5 %   Platelets 174  150 - 400 K/uL  COMPREHENSIVE METABOLIC PANEL      Component Value Range   Sodium 138  135 - 145 mEq/L   Potassium 3.3 (*) 3.5 - 5.1 mEq/L   Chloride 100  96 - 112 mEq/L   CO2 26  19 - 32 mEq/L   Glucose, Bld 87  70 - 99 mg/dL   BUN 13  6 - 23 mg/dL   Creatinine, Ser 6.38  0.50 - 1.35 mg/dL   Calcium 9.2  8.4 - 75.6 mg/dL   Total Protein 7.2  6.0 - 8.3 g/dL   Albumin 4.1  3.5 - 5.2 g/dL   AST 22  0 - 37 U/L   ALT 10  0 - 53  U/L   Alkaline Phosphatase 74  39 - 117 U/L   Total Bilirubin 0.2 (*) 0.3 - 1.2 mg/dL   GFR calc non Af Amer >90  >90 mL/min   GFR calc Af Amer >90  >90 mL/min  ETHANOL      Component Value Range   Alcohol, Ethyl (B) <11  0 - 11 mg/dL  URINE RAPID DRUG SCREEN (HOSP PERFORMED)      Component Value Range   Opiates NONE DETECTED  NONE DETECTED   Cocaine NONE DETECTED  NONE DETECTED   Benzodiazepines NONE DETECTED  NONE DETECTED   Amphetamines NONE DETECTED  NONE DETECTED   Tetrahydrocannabinol POSITIVE (*) NONE DETECTED   Barbiturates NONE DETECTED  NONE DETECTED   Pt with SI, suspect just upset from a break up and unable to deal with this stressor due to underlying mental disorder. Will get Telepsych consult. Pt medically cleared.    1. Suicidal ideation   2. Depression       MDM          Lottie Mussel, PA 02/02/12 1930

## 2012-02-01 NOTE — ED Notes (Signed)
telepsych info faxed and called 

## 2012-02-01 NOTE — ED Notes (Signed)
Given Ativan with [employer] stand by assistance. Patient cooperative and more calm

## 2012-02-01 NOTE — ED Notes (Signed)
Step-mother is here and has brought the pt's meds, and reports that he receives a shot once a month. Meds:  Risperidone 3mg  --2 tablets at bedside             Benztropine 1mg --1 tablet bid             Tegretol 200mg -1 tablet in the morning, 2 tablets at bed time

## 2012-02-01 NOTE — ED Notes (Signed)
Haldol given with stand by assistance from security and [employer]. Crying and asking for his mom.

## 2012-02-01 NOTE — ED Notes (Signed)
Pt ambulatory to room, vomiting on arrival.  PA into see

## 2012-02-01 NOTE — ED Provider Notes (Signed)
TelePsych recs IVC, Pt anxious/angry/agitated wants to leave so chemical restraint ordered with Haldol/Ativan. 1820  Hurman Horn, MD 02/08/12 2258

## 2012-02-01 NOTE — ED Notes (Signed)
Nausea resolved requesting food.

## 2012-02-01 NOTE — ED Notes (Signed)
Sleeping, eaisly aroused, pt calmer, but angry about having to have frequent chcks

## 2012-02-01 NOTE — ED Notes (Signed)
telepsych complete 

## 2012-02-01 NOTE — ED Notes (Signed)
Pt calmer, but still angry about having to stay.  Pt also concerned about having a court date scheduled on Tuesday.

## 2012-02-01 NOTE — ED Notes (Signed)
Dr Fonnie Jarvis into see

## 2012-02-02 MED ORDER — MIDAZOLAM HCL 2 MG/2ML IJ SOLN
5.0000 mg | Freq: Once | INTRAMUSCULAR | Status: AC
  Administered 2012-02-02: 5 mg via INTRAMUSCULAR

## 2012-02-02 MED ORDER — RISPERIDONE MICROSPHERES 37.5 MG IM SUSR
37.5000 mg | INTRAMUSCULAR | Status: DC
  Administered 2012-02-02: 37.5 mg via INTRAMUSCULAR
  Filled 2012-02-02 (×2): qty 2

## 2012-02-02 MED ORDER — CARBAMAZEPINE 200 MG PO TABS: 200.0000 mg | ORAL_TABLET | ORAL | Status: DC

## 2012-02-02 MED ORDER — RISPERIDONE MICROSPHERES 37.5 MG IM SUSR
37.5000 mg | INTRAMUSCULAR | Status: DC
  Filled 2012-02-02: qty 2

## 2012-02-02 MED ORDER — CARBAMAZEPINE 200 MG PO TABS
400.0000 mg | ORAL_TABLET | Freq: Every day | ORAL | Status: DC
  Administered 2012-02-02: 400 mg via ORAL
  Filled 2012-02-02 (×2): qty 2

## 2012-02-02 MED ORDER — RISPERIDONE 2 MG PO TBDP
6.0000 mg | ORAL_TABLET | Freq: Every day | ORAL | Status: DC
  Administered 2012-02-02: 6 mg via ORAL
  Filled 2012-02-02 (×2): qty 3

## 2012-02-02 MED ORDER — CARBAMAZEPINE 200 MG PO TABS
200.0000 mg | ORAL_TABLET | Freq: Every day | ORAL | Status: DC
  Administered 2012-02-02 – 2012-02-03 (×2): 200 mg via ORAL
  Filled 2012-02-02 (×2): qty 1

## 2012-02-02 MED ORDER — CARBAMAZEPINE 200 MG PO TABS: 400.0000 mg | ORAL_TABLET | Freq: Every day | ORAL | Status: DC

## 2012-02-02 MED ORDER — HALOPERIDOL LACTATE 5 MG/ML IJ SOLN: 5.0000 mg | Freq: Once | INTRAMUSCULAR | Status: DC

## 2012-02-02 MED ORDER — RISPERIDONE 2 MG PO TABS: 6.0000 mg | ORAL_TABLET | Freq: Every day | ORAL | Status: DC

## 2012-02-02 MED ORDER — MIDAZOLAM HCL 2 MG/2ML IJ SOLN
INTRAMUSCULAR | Status: AC
  Administered 2012-02-02: 5 mg via INTRAMUSCULAR
  Filled 2012-02-02: qty 6

## 2012-02-02 MED ORDER — RISPERIDONE 2 MG PO TBDP
6.0000 mg | ORAL_TABLET | Freq: Every day | ORAL | Status: DC
  Filled 2012-02-02: qty 3

## 2012-02-02 MED ORDER — HALOPERIDOL LACTATE 5 MG/ML IJ SOLN
5.0000 mg | Freq: Four times a day (QID) | INTRAMUSCULAR | Status: DC | PRN
  Administered 2012-02-02: 5 mg via INTRAMUSCULAR

## 2012-02-02 MED ORDER — HALOPERIDOL 2 MG PO TABS
2.0000 mg | ORAL_TABLET | Freq: Once | ORAL | Status: AC
  Administered 2012-02-02: 2 mg via ORAL

## 2012-02-02 MED ORDER — LORAZEPAM 2 MG/ML IJ SOLN
2.0000 mg | Freq: Once | INTRAMUSCULAR | Status: AC
  Administered 2012-02-02: 2 mg via INTRAMUSCULAR

## 2012-02-02 NOTE — Progress Notes (Addendum)
Pt requested to speak with CSW regarding pt discharge. CSW and pt discussed that at this time patient was under ivc and the psychiatrist was recommended inpatient stay at this time. Pt denies SI or HI at this time. Pt states, "i'm tired of the white people make me do something. I want to change my name, just because my name is Erik Vargas I always run into problems someone's always got a problem with [name]."  Per chart review, pt has been very angry and verbally aggressive.   Catha Gosselin, LCSWA  [phone] .02/02/2012 9:04pm

## 2012-02-02 NOTE — ED Provider Notes (Signed)
8:07 AM . Patient asleep.  Per RN, no acute events overnight.  But, the patient is requesting prompt psych eval and wants to leave.  He was informed of the pending status of his consult.    1/19 telepsych consult rec'd inpatient admission.  Gerhard Munch, MD 02/02/12 [phone]

## 2012-02-02 NOTE — ED Notes (Signed)
Pt awake standing at nurses station requesting to go home/see someone about going home.

## 2012-02-02 NOTE — Progress Notes (Addendum)
Pt requested to speak with csw again. Pt states, "If I dont get out of here tomorrow I will be acting a lot worse. I dont mean to be cussing ya'll but I want to leave." CSW and pt discussed once again that patient would have to be psychiatrically stable and cleared by a psychiatrist. Pt stated, "man this is why i'm upset, everyone just coming in here keeping me awake to give me this and that, when i just want to go home so I can be safe to me and to others. Its like everyone just wants to come in and see what's between my legs." CSW attempted to redirect patient explaining that patient would be here tonight and can talk to psychiatrist tomorrow. CSW asked patient if he wanted the tv on, and he said, "No I aint no girl".  At that time patient laid back down on bed and csw excused self form patient room.   Catha Gosselin, LCSWA  [phone] .02/02/2012 1127pm

## 2012-02-02 NOTE — ED Notes (Addendum)
Was going to make another phone call. Informed patient this was his 3rd call, got upset and stated he had not used the phone 2x before, put the phone up, went to his room, closed the door, turned on the call light in his room, when this nurse went to turn off light, he was informed not to turn his light on, then got up closed his door and voided in the floor. Informed patient these behaviors would not get him discharged home.

## 2012-02-02 NOTE — Progress Notes (Addendum)
[id number]   Erik Vargas    October 15, 1980 CC: Suicidal with attempt to hang himself with a belt.  Met with the patient for report of suicidal ideation. Patient is a 81 yr. [name] who arrived at the ED via ambulance with reports of tying a belt around his neck in an attempt to kill himself.  He reports this is not true, but he does admit to being depressed.  Erik Vargas states he has been depressed over his girlfriend "pushing him away recently" and says he has been staying in bed all day, not eating, increased sadness.  He reports he is sleeping well, eating well He denies any substance abuse or alcohol use.  Past Psych Hist:  Patient states he was at [facility] for about a year, 1 year ago for Schizophrenia. Social: He is single and lives in a Rooming house run by [employer]", whom his father, who is his Payee, pays 300.00 a month.  He states he is on disability for his Schizophrenia.  ,BP 110/70  Pulse 72  Temp 98.7 F (37.1 C) (Oral)  Resp 16  SpO2 99% The patient is wearing paper scrubs and is initially pleasant but grows easily frustrated with the questioning. He is alert and initially cooperative. He is oriented 2/3.  He rates his depression at about 5-6, he denies SI/HI, or AVH.  He does state that he sees his grandmother at funerals but does not elaborate.  He notes that his anxiety is increased he states due to the issues with his girl friend.  Today he is becoming more agitated and demanding immediate discharge as he has a court date tomorrow for disorderly conduct charge.   His speech is pressured but goal directed and profane. He shows little tolerance for delayed gratification.  He is a poor historian and is unable to give me his current medications, the name of his MD, or the pharmacy he uses.  He is unwilling to allow me to speak with his father for collateral information.  He does call his stepmother [name] who gives [place] his current medications which are now verified at [facility] on [address].  Assessment:  Schizoaffective disorder with recent suicide attempt Medical non compliance Cannabis abuse  Plan: 1. Patient is given Haldol 5mg  IM for escalating agitation. 2. Patient is given Ativan 2mg  IM for escalating agitation. 3. Home medication has now been verified and entered into the chart. 4. Will restart medication as written. 5. Cogentin 1mg  po BID for side effects. 6. Risperdal 3mg  disolving tablets 2 po at hs. 7. D/C depakote. 8 Restart Tegratol 200mg   1 in AM, and 2 at HS. For mood stabilization. 9. Risperdal Consta 32.5mg  IM to start tomorrow. 10. ACT should verify if this patient has an ACT team or if he qualifies for ACT.  Continue current plan of action and re-evaluate for need for admission. Currently he is not stable for [facility].  Rona Ravens. [place] [facility] 02/02/2012 3:32 PM

## 2012-02-02 NOTE — ED Notes (Signed)
Patient has phone in room, ask patient to return phone or give it to this nurse, stated he was told as long as he was quiet and behaved he could have the phone wherever he wanted, informed patient that was incorrect, the rule is to use the phone and stay in front of the nurses station. He took phone to nurses station, sat down in the floor and made his call. When the call was over, patient went to room, then laid down on the floor on the opposite side of the hall, MHT and this nurse asked patient to get up and go to his room, asked several times, got up and stood against the basin in hallway, asked again to go to room, stated he has "pissed" on himself and wasnot going to room,he needed air, encouraged to take shower to get of the urine smell, states he is not going to take a cold shower, wants to be discharged so he can go home and take a shower, [employer] asked patient to go to room, Security came and assisted patient back to his room. Made the statement "she has been on me all day".

## 2012-02-03 ENCOUNTER — Telehealth (HOSPITAL_COMMUNITY): Payer: Self-pay | Admitting: Licensed Clinical Social Worker

## 2012-02-03 DIAGNOSIS — F259 Schizoaffective disorder, unspecified: Secondary | ICD-10-CM

## 2012-02-03 DIAGNOSIS — F1210 Cannabis abuse, uncomplicated: Secondary | ICD-10-CM

## 2012-02-03 MED ORDER — CARBAMAZEPINE 200 MG PO TABS: 200.0000 mg | ORAL_TABLET | Freq: Every day | ORAL | Status: DC

## 2012-02-03 MED ORDER — CARBAMAZEPINE 200 MG PO TABS: 400.0000 mg | ORAL_TABLET | Freq: Every day | ORAL | Status: DC

## 2012-02-03 NOTE — ED Notes (Signed)
Spoke with ACT team.  Waiting for father to pick up pt.  Pt states he wants to ride the bus home.  ACT team and psychiatrist state they would rather pt ride with father.  Pt called father and father states it would be fine if pt takes bus home.  ACT teams stated that would be fine.

## 2012-02-03 NOTE — BH Assessment (Signed)
[facility] Assessment Progress Note    Following discharge plan which was requested by Dr. Oneta Rack patients nurse asked multiple x's if patient could be discharged w/ bus pass. Writer informed patients nurse that patient would be picked up by his father and transported to the boarding house. Patient called father and he stated that he would not pick patient up from the ED if he was riding the bus home.   Patient discharged home with a bus pass provided by his nurse.

## 2012-02-03 NOTE — [facility] Suicide Risk Assessment (Signed)
Suicide Risk Assessment  Discharge Assessment     Demographic Factors:  Male, Adolescent or young adult and Low socioeconomic status  Mental Status Per Nursing Assessment::   On Admission:     Current Mental Status by Physician: Agitation and aggressive behaviors  Loss Factors: Loss of significant relationship and Financial problems/change in socioeconomic status  Historical Factors: Impulsivity and Noncompliant with medication  Risk Reduction Factors:   Sense of responsibility to family, Religious beliefs about death, Living with another person, especially a relative, Positive social support and Positive therapeutic relationship  Continued Clinical Symptoms:  Severe Anxiety and/or Agitation Bipolar Disorder:   Mixed State Alcohol/Substance Abuse/Dependencies Schizophrenia:   Paranoid or undifferentiated type Unstable or Poor Therapeutic Relationship  Cognitive Features That Contribute To Risk:  Polarized thinking    Suicide Risk:  Minimal: No identifiable suicidal ideation.  Patients presenting with no risk factors but with morbid ruminations; may be classified as minimal risk based on the severity of the depressive symptoms  Discharge Diagnoses:   AXIS I:  Schizoaffective Disorder, Substance Abuse and Noncompliant with medication AXIS II:  Deferred AXIS III:   Past Medical History  Diagnosis Date  . Seizures   . Depression   . Schizophrenia    AXIS IV:  economic problems, occupational problems and problems with access to health care services AXIS V:  41-50 serious symptoms  Plan Of Care/Follow-up recommendations:  Activity:  As tolerated Diet:  Regular  Is patient on multiple antipsychotic therapies at discharge:  No   Has Patient had three or more failed trials of antipsychotic monotherapy by history:  No  Recommended Plan for Multiple Antipsychotic Therapies: Not applicable  Teriyah Purington,JANARDHAHA R. 02/03/2012, 2:54 PM

## 2012-02-03 NOTE — Consult Note (Signed)
Reason for Consult: anger out burst Referring Physician: Dr. [name] is an 32 y.o. male.  HPI: Patient was seen and chart reviewed. Patient has history of chronic schizoaffective disorder, cannabis abuse, and noncompliance with medication. Patient came to the [facility] long emergency department on who 02/01/2011, after disagreement with his girlfriend. Reportedly misunderstanding and thought she was cheating on him. Patient reported spoke with his father and stepmother asking them to help him out because of he was angry. Reportedly they asked him to seek emergency medical care or involuntary commitment. Patient was agree to get emergency care and than he was evaluated yesterday by specialist on call,  in which he could not understand the  physician, who given him  recommendations, which made him upset and angry, started crying, yelling, and cursing. Since he received his medication for the last 24 hours, he was calm, quite uncooperative. Patient denied current suicidal or homicidal ideation, intention, or plans. Patient is willing to take his the prescribed medication and followup with the his primary care providers. Patient contract for safety. Spoke with his family and also [name] who is a one of the boarding house conformed. He has a place to leave and family to care for. Patient has a previous psychiatric hospitalization at the [facility] and also examines to the hospital.   MSE: Patient was appeared  standing on hallway waiting to see the physician and was started requesting to be discharging to home and than emphasized that he was not suicidal, not homicidal not danger to himself or others. Patient denies hallucinations. Patient seems to be upset and loud when he was not understood the provider instructions and felt He was not listened.   Past Medical History  Diagnosis Date  . Seizures   . Depression   . Schizophrenia     History reviewed. No  pertinent past surgical history.  No family history on file.  Social History:  reports that he has been smoking Cigarettes.  He has a 5 pack-year smoking history. He does not have any smokeless tobacco history on file. He reports that he does not drink alcohol or use illicit drugs.  Allergies: No Known Allergies  Medications: I have reviewed the patient's current medications.  No results found for this or any previous visit (from the past 48 hour(s)).  No results found.  Positive for aggressive behavior  Blood pressure 120/79, pulse 97, temperature 98.1 F (36.7 C), temperature source Oral, resp. rate 16, SpO2 97.00%.   Assessment/Plan: Schizoaffective disorder Cannabis abuse  Recommendation:  referred him outpatient psychiatric services with medication management, and other services needed for him. He'll be discharged on his current medication Risperdal M. tablets 6 mg daily at bedtime, Tegretol 200 mg Qam and 400 mg Qhs and he was received Risperdal Consta 37.5 mg IM on 02/02/2012 and next scheduled dose will be Feb 16, 2012 at Primary psychiatric services in [place].    Erik Vargas,Erik R. 02/03/2012, 2:25 PM

## 2012-02-03 NOTE — BH Assessment (Signed)
[facility] Assessment Progress Note   Patient not appropriate for inpatient treatment, per evaluation by psychiatrist Dr. Elsie Saas. In order to provide a appropriate discharge plan for this patient Dr. Elsie Saas has asked this writer to contact patients group home? or boarding house? Patients mother and father Erik Vargas and Erik Vargas) were # [phone] (home) & [phone] (cell) were both contacted to assist with appropriate discharge planning. Patients father agreed to assist in this situation. Patient's father confirmed that patient lives in a boarding house; contacted the boarding house; confirmed that patient was able to return; and was agreeable to picking patient up and transporting him to that particular boarding house.

## 2012-02-03 NOTE — ED Notes (Signed)
Pt very anxious. Continues asking when psychiatrist will speak with him.  Pt reassured that MD was here and would see him this afternoon.

## 2012-02-03 NOTE — ED Provider Notes (Signed)
Patient is pending admission he had undergone tell a psychiatry consult they recommended admission. Patient is awaiting placement.  Erik Jakes, MD 02/03/12 [phone]

## 2012-02-04 NOTE — ED Provider Notes (Signed)
Medical screening examination/treatment/procedure(s) were performed by non-physician practitioner and as supervising physician I was immediately available for consultation/collaboration.   Lyanne Co, MD 02/04/12 [phone]

## 2012-05-15 ENCOUNTER — Emergency Department (HOSPITAL_COMMUNITY)
Admission: EM | Admit: 2012-05-15 | Discharge: 2012-05-15 | Disposition: A | Discharge status: Other Acute Inpatient Hospital | Payer: Medicaid Other | Attending: Emergency Medicine | Admitting: Emergency Medicine

## 2012-05-15 ENCOUNTER — Encounter (HOSPITAL_COMMUNITY): Payer: Self-pay | Admitting: Emergency Medicine

## 2012-05-15 DIAGNOSIS — Z79899 Other long term (current) drug therapy: Secondary | ICD-10-CM | POA: Insufficient documentation

## 2012-05-15 DIAGNOSIS — R45851 Suicidal ideations: Secondary | ICD-10-CM | POA: Insufficient documentation

## 2012-05-15 DIAGNOSIS — G40909 Epilepsy, unspecified, not intractable, without status epilepticus: Secondary | ICD-10-CM | POA: Insufficient documentation

## 2012-05-15 DIAGNOSIS — F29 Unspecified psychosis not due to a substance or known physiological condition: Secondary | ICD-10-CM | POA: Insufficient documentation

## 2012-05-15 DIAGNOSIS — F17200 Nicotine dependence, unspecified, uncomplicated: Secondary | ICD-10-CM | POA: Insufficient documentation

## 2012-05-15 DIAGNOSIS — IMO0002 Reserved for concepts with insufficient information to code with codable children: Secondary | ICD-10-CM | POA: Insufficient documentation

## 2012-05-15 DIAGNOSIS — F209 Schizophrenia, unspecified: Secondary | ICD-10-CM | POA: Insufficient documentation

## 2012-05-15 LAB — RAPID URINE DRUG SCREEN, HOSP PERFORMED
Amphetamines: NOT DETECTED
Benzodiazepines: NOT DETECTED
Cocaine: NOT DETECTED
Opiates: NOT DETECTED
Tetrahydrocannabinol: POSITIVE — AB

## 2012-05-15 LAB — CBC
MCH: 28.3 pg (ref 26.0–34.0)
MCHC: 33.6 g/dL (ref 30.0–36.0)
Platelets: 151 10*3/uL (ref 150–400)

## 2012-05-15 LAB — COMPREHENSIVE METABOLIC PANEL
ALT: 14 U/L (ref 0–53)
AST: 28 U/L (ref 0–37)
Albumin: 3.9 g/dL (ref 3.5–5.2)
Alkaline Phosphatase: 72 U/L (ref 39–117)
Calcium: 9.4 mg/dL (ref 8.4–10.5)
GFR calc Af Amer: >90 mL/min (ref >90)
Potassium: 3.3 mEq/L — ABNORMAL LOW (ref 3.5–5.1)
Sodium: 142 mEq/L (ref 135–145)
Total Protein: 6.6 g/dL (ref 6.0–8.3)

## 2012-05-15 LAB — ACETAMINOPHEN LEVEL: Acetaminophen (Tylenol), Serum: <15 ug/mL (ref 10–30)

## 2012-05-15 MED ORDER — ZIPRASIDONE MESYLATE 20 MG IM SOLR
10.0000 mg | Freq: Once | INTRAMUSCULAR | Status: AC
  Administered 2012-05-15: 10 mg via INTRAMUSCULAR
  Filled 2012-05-15: qty 20

## 2012-05-15 NOTE — ED Provider Notes (Addendum)
History     CSN: [id number]  Arrival date & time 05/15/12  0136   First MD Initiated Contact with Patient 05/15/12 0250      Chief Complaint  Patient presents with  . Medical Clearance   Level V caveat for psychosis HPI Erik Vargas is a 32 y.o. male presented to [facility] earlier today actively psychotic, hallucinating, threatening to harm himself and others by walking into traffic. Patient was reported to be hostile and aggressive and is behaving hostile in the emergency department. He is a tearful, agitated, hitting his head into the wall at times. Patient refuses to cooperate, and will not answer questions stating "why does it matter what's between my legs" "I'm fine, let me got home!"    Past Medical History  Diagnosis Date  . Seizures   . Depression   . Schizophrenia     History reviewed. No pertinent past surgical history.  No family history on file.  History  Substance Use Topics  . Smoking status: Current Every Day Smoker -- 1.00 packs/day for 5 years    Types: Cigarettes  . Smokeless tobacco: Not on file  . Alcohol Use: Yes      Review of Systems Level V caveat for psychosis Allergies  Review of patient's allergies indicates no known allergies.  Home Medications   Current Outpatient Rx  Name  Route  Sig  Dispense  Refill  . benztropine (COGENTIN) 1 MG tablet   Oral   Take 1 mg by mouth 2 (two) times daily.         . carbamazepine (TEGRETOL) 200 MG tablet   Oral   Take 1 tablet (200 mg total) by mouth daily.   10 tablet   0   . carbamazepine (TEGRETOL) 200 MG tablet   Oral   Take 2 tablets (400 mg total) by mouth at bedtime.   20 tablet   0   . risperiDONE (RISPERDAL) 3 MG tablet   Oral   Take 6 mg by mouth at bedtime.         . risperiDONE microspheres (RISPERDAL CONSTA) 37.5 MG injection   Intramuscular   Inject 37.5 mg into the muscle every 14 (fourteen) days.           BP 127/68  Pulse 83  Temp(Src) 97.9 F (36.6 C)  (Oral)  Resp 20  SpO2 100%  Physical Exam  Nursing notes reviewed.  Electronic medical record reviewed. VITAL SIGNS:   Filed Vitals:   05/15/12 0141  BP: 127/68  Pulse: 83  Temp: 97.9 F (36.6 C)  TempSrc: Oral  Resp: 20  SpO2: 100%   CONSTITUTIONAL: Awake, oriented, appears non-toxic, agitated, appears angry rocking back-and-forth HENT: Atraumatic, normocephalic, oral mucosa pink and moist, airway patent. Nares patent without drainage. External ears normal. EYES: Conjunctiva clear, EOMI, PERRLA NECK: Trachea midline, non-tender, supple CARDIOVASCULAR: Normal heart rate, Normal rhythm, No murmurs, rubs, gallops PULMONARY/CHEST: Clear to auscultation, no rhonchi, wheezes, or rales. Symmetrical breath sounds. Non-tender. ABDOMINAL: Non-distended, soft, non-tender - no rebound or guarding.  BS normal. NEUROLOGIC: Non-focal, moving all four extremities, no gross sensory or motor deficits. EXTREMITIES: No clubbing, cyanosis, or edema SKIN: Warm, Dry, No erythema, No rash Psychiatric: Agitated, angry young male, possibly responding to internal stimuli, tangential, not cooperative with questioning  ED Course  Procedures (including critical care time)  Labs Reviewed  CBC - Abnormal; Notable for the following:    HCT 38.7 (*)    All other components within normal limits  COMPREHENSIVE METABOLIC PANEL - Abnormal; Notable for the following:    Potassium 3.3 (*)    All other components within normal limits  SALICYLATE LEVEL - Abnormal; Notable for the following:    Salicylate Lvl <2.0 (*)    All other components within normal limits  URINE RAPID DRUG SCREEN (HOSP PERFORMED) - Abnormal; Notable for the following:    Tetrahydrocannabinol POSITIVE (*)    All other components within normal limits  CARBAMAZEPINE LEVEL, TOTAL - Abnormal; Notable for the following:    Carbamazepine Lvl <0.5 (*)    All other components within normal limits  ACETAMINOPHEN LEVEL  ETHANOL   No results  found.   1. Psychosis       MDM  Patient arrives for medical clearance for a [facility] under IVC for suicidal ideation and psychosis.  Patient does not have any obvious physical defects or deformities, does not appear injured, is medically clear for psychiatric admission.  Pt appears psychotic and in need of in-patient psychiatric services.        Jones Skene, MD 05/15/12 4034  Jones Skene, MD 05/15/12 1424

## 2012-05-15 NOTE — ED Notes (Signed)
Pt brought to ED by [facility] under IVC. Per IVC papers pt is hallucinating, threatening to harm himself and others. Pt is reported to be hostile and aggressive. Pt tearful and agitated in triage.

## 2012-05-15 NOTE — ED Notes (Signed)
Pt sobbing loudly in consult room. Pt ask if he needed blanket, food or medication, pt yelled "i dont need nothing from you" then refused to answer any further questions. Security and [employer] at doorway

## 2012-05-15 NOTE — ED Notes (Signed)
Results faxed to [place], [place] RN contacted, reprort given, instructed to return pt to [facility]

## 2012-08-16 ENCOUNTER — Encounter (HOSPITAL_COMMUNITY): Payer: Self-pay | Admitting: Emergency Medicine

## 2012-08-16 ENCOUNTER — Emergency Department (HOSPITAL_COMMUNITY)
Admission: EM | Admit: 2012-08-16 | Discharge: 2012-08-17 | Disposition: A | Discharge status: Home / Self Care | Payer: Medicaid Other | Attending: Emergency Medicine | Admitting: Emergency Medicine

## 2012-08-16 DIAGNOSIS — F3289 Other specified depressive episodes: Secondary | ICD-10-CM | POA: Insufficient documentation

## 2012-08-16 DIAGNOSIS — R569 Unspecified convulsions: Secondary | ICD-10-CM | POA: Insufficient documentation

## 2012-08-16 DIAGNOSIS — Z79899 Other long term (current) drug therapy: Secondary | ICD-10-CM | POA: Insufficient documentation

## 2012-08-16 DIAGNOSIS — F17200 Nicotine dependence, unspecified, uncomplicated: Secondary | ICD-10-CM | POA: Insufficient documentation

## 2012-08-16 DIAGNOSIS — F319 Bipolar disorder, unspecified: Secondary | ICD-10-CM | POA: Insufficient documentation

## 2012-08-16 DIAGNOSIS — F209 Schizophrenia, unspecified: Secondary | ICD-10-CM | POA: Insufficient documentation

## 2012-08-16 DIAGNOSIS — F329 Major depressive disorder, single episode, unspecified: Secondary | ICD-10-CM | POA: Insufficient documentation

## 2012-08-16 HISTORY — DX: Bipolar disorder, unspecified: F31.9

## 2012-08-16 LAB — COMPREHENSIVE METABOLIC PANEL
ALT: 15 U/L (ref 0–53)
AST: 24 U/L (ref 0–37)
Albumin: 4.1 g/dL (ref 3.5–5.2)
Calcium: 9.4 mg/dL (ref 8.4–10.5)
GFR calc Af Amer: >90 mL/min (ref >90)
Potassium: 4.1 mEq/L (ref 3.5–5.1)
Sodium: 141 mEq/L (ref 135–145)
Total Protein: 6.8 g/dL (ref 6.0–8.3)

## 2012-08-16 LAB — CBC
MCH: 28.6 pg (ref 26.0–34.0)
MCHC: 32.5 g/dL (ref 30.0–36.0)
Platelets: 198 10*3/uL (ref 150–400)
RDW: 13.3 % (ref 11.5–15.5)

## 2012-08-16 LAB — RAPID URINE DRUG SCREEN, HOSP PERFORMED
Amphetamines: NOT DETECTED
Barbiturates: NOT DETECTED
Benzodiazepines: NOT DETECTED
Cocaine: POSITIVE — AB
Tetrahydrocannabinol: POSITIVE — AB

## 2012-08-16 MED ORDER — ACETAMINOPHEN 325 MG PO TABS: 650.0000 mg | ORAL_TABLET | ORAL | Status: DC | PRN

## 2012-08-16 MED ORDER — LORAZEPAM 1 MG PO TABS: 1.0000 mg | ORAL_TABLET | Freq: Three times a day (TID) | ORAL | Status: DC | PRN

## 2012-08-16 MED ORDER — NICOTINE 21 MG/24HR TD PT24: 21.0000 mg | MEDICATED_PATCH | Freq: Every day | TRANSDERMAL | Status: DC

## 2012-08-16 NOTE — ED Notes (Signed)
Pt undressed and wanded by security

## 2012-08-16 NOTE — ED Notes (Signed)
Pt presenting to ed with c/o being off his medications x 3-4 weeks pt states he is seeing things pt denies hallucinations at this time. Pt denies SI/HI at this time. Pt states he needs to get back on his medications

## 2012-08-16 NOTE — ED Provider Notes (Signed)
CSN: [id number]     Arrival date & time 08/16/12  1145 History     First MD Initiated Contact with Patient 08/16/12 1200     Chief Complaint  Patient presents with  . Medical Clearance   (Consider location/radiation/quality/duration/timing/severity/associated sxs/prior Treatment) The history is provided by the patient.  pt w hx schizophrenia, depression, indicates he has been off his meds for 3-4 weeks. States at times he feels as if he is seeing things, although cant say specifically what he is seeing. Says his thoughts race at times. Pt very difficult historian, stating his family told him he needed to go to ED and be assessed and possibly placed back on his meds. Pt states he has a therapist, but cant recall name. Pt denies any recent health problems or symptoms. Denies etoh or substance abuse.      Past Medical History  Diagnosis Date  . Seizures   . Depression   . Schizophrenia   . Bipolar 1 disorder    History reviewed. No pertinent past surgical history. No family history on file. History  Substance Use Topics  . Smoking status: Current Every Day Smoker -- 2.00 packs/day for 5 years    Types: Cigarettes  . Smokeless tobacco: Not on file  . Alcohol Use: Yes     Comment: weekends    Review of Systems  Constitutional: Negative for fever.  HENT: Negative for neck pain.   Eyes: Negative for redness.  Respiratory: Negative for shortness of breath.   Cardiovascular: Negative for chest pain.  Gastrointestinal: Negative for abdominal pain.  Genitourinary: Negative for flank pain.  Musculoskeletal: Negative for back pain.  Skin: Negative for rash.  Neurological: Negative for headaches.  Hematological: Does not bruise/bleed easily.  Psychiatric/Behavioral: The patient is nervous/anxious.     Allergies  Review of patient's allergies indicates no known allergies.  Home Medications   Current Outpatient Rx  Name  Route  Sig  Dispense  Refill  . benztropine (COGENTIN) 1  MG tablet   Oral   Take 1 mg by mouth 2 (two) times daily.         . carbamazepine (TEGRETOL) 200 MG tablet   Oral   Take 1 tablet (200 mg total) by mouth daily.   10 tablet   0   . carbamazepine (TEGRETOL) 200 MG tablet   Oral   Take 2 tablets (400 mg total) by mouth at bedtime.   20 tablet   0   . risperiDONE (RISPERDAL) 3 MG tablet   Oral   Take 6 mg by mouth at bedtime.         . risperiDONE microspheres (RISPERDAL CONSTA) 37.5 MG injection   Intramuscular   Inject 37.5 mg into the muscle every 14 (fourteen) days.          BP 130/79  Pulse 99  Temp(Src) 98.5 F (36.9 C) (Oral)  Resp 16  SpO2 98% Physical Exam  Nursing note and vitals reviewed. Constitutional: He is oriented to person, place, and time. He appears well-developed and well-nourished. No distress.  HENT:  Head: Atraumatic.  Eyes: Pupils are equal, round, and reactive to light.  Neck: Neck supple. No tracheal deviation present.  Cardiovascular: Normal rate, regular rhythm, normal heart sounds and intact distal pulses.   Pulmonary/Chest: Effort normal and breath sounds normal. No accessory muscle usage. No respiratory distress.  Abdominal: Soft. He exhibits no distension. There is no tenderness.  Musculoskeletal: Normal range of motion. He exhibits no edema and no  tenderness.  Neurological: He is alert and oriented to person, place, and time.  Skin: Skin is warm and dry.  Psychiatric:  Confused thought processes. Flat affect.     ED Course   Procedures (including critical care time)  Results for orders placed during the hospital encounter of 08/16/12  CBC      Result Value Range   WBC 5.3  4.0 - 10.5 K/uL   RBC 4.30  4.22 - 5.81 MIL/uL   Hemoglobin 12.3 (*) 13.0 - 17.0 g/dL   HCT 16.1 (*) 09.6 - 04.5 %   MCV 87.9  78.0 - 100.0 fL   MCH 28.6  26.0 - 34.0 pg   MCHC 32.5  30.0 - 36.0 g/dL   RDW 40.9  81.1 - 91.4 %   Platelets 198  150 - 400 K/uL  COMPREHENSIVE METABOLIC PANEL       Result Value Range   Sodium 141  135 - 145 mEq/L   Potassium 4.1  3.5 - 5.1 mEq/L   Chloride 106  96 - 112 mEq/L   CO2 25  19 - 32 mEq/L   Glucose, Bld 98  70 - 99 mg/dL   BUN 20  6 - 23 mg/dL   Creatinine, Ser 7.82  0.50 - 1.35 mg/dL   Calcium 9.4  8.4 - 95.6 mg/dL   Total Protein 6.8  6.0 - 8.3 g/dL   Albumin 4.1  3.5 - 5.2 g/dL   AST 24  0 - 37 U/L   ALT 15  0 - 53 U/L   Alkaline Phosphatase 61  39 - 117 U/L   Total Bilirubin 0.2 (*) 0.3 - 1.2 mg/dL   GFR calc non Af Amer >90  >90 mL/min   GFR calc Af Amer >90  >90 mL/min  ETHANOL      Result Value Range   Alcohol, Ethyl (B) <11  0 - 11 mg/dL  URINE RAPID DRUG SCREEN (HOSP PERFORMED)      Result Value Range   Opiates NONE DETECTED  NONE DETECTED   Cocaine POSITIVE (*) NONE DETECTED   Benzodiazepines NONE DETECTED  NONE DETECTED   Amphetamines NONE DETECTED  NONE DETECTED   Tetrahydrocannabinol POSITIVE (*) NONE DETECTED   Barbiturates NONE DETECTED  NONE DETECTED       MDM  Labs.   Reviewed nursing notes and prior charts for additional history.   Recheck alert, content, awaiting act eval.  Pt moved to psych ed.  Act/psych team eval pending - pt medically cleared, dispo per psych team.    Suzi Roots, MD 08/16/12 1311

## 2012-08-16 NOTE — Consult Note (Signed)
[facility] Psychiatry Consult   Reason for Consult:  Medication Adjustment Referring Physician:  [name] is an 32 y.o. male.  Assessment: AXIS I:  schizophrenia AXIS II:  Deferred AXIS III:   Past Medical History  Diagnosis Date  . Seizures   . Depression   . Schizophrenia   . Bipolar 1 disorder    AXIS IV:  educational problems, housing problems, occupational problems, other psychosocial or environmental problems and problems related to social environment AXIS V:  51-60 moderate symptoms  Plan:  No evidence of imminent risk to self or others at present.    Subjective:   Erik Vargas is a 32 y.o. male patient admitted with Schizophrenia, medication non compliance.  HPI:  pt w hx schizophrenia, depression, indicates he has been off his meds for 3-4 weeks. States at times he feels as if he is seeing things, although cant say specifically what he is seeing.. Pt is a very poor and  difficult historian, stating his family told him he needed to go to ED and be assessed and possibly placed back on his meds. Pt states he has a therapist, but cant recall name and he states he sees a Therapist, sports at [employer]. Pt denies any recent health problems or symptoms. Denies etoh or substance abuse however his Urine drug screen is positive for Cocaine and marijuana use.  This is his 72 th visit to the ER this year from January.  When questioned again why he is here today he said for medication management and for the doctor to look at his finger nails because he bites them.  He will spend the night in [facility] for another reevaluation .  Right now he does not meet criteria for admission.  Past Psychiatric History: Past Medical History  Diagnosis Date  . Seizures   . Depression   . Schizophrenia   . Bipolar 1 disorder     reports that he has been smoking Cigarettes.  He has a 10 pack-year smoking history. He does not have any smokeless tobacco history on file. He reports that  drinks  alcohol. He reports that he uses illicit drugs (Marijuana). No family history on file.         Allergies:  No Known Allergies  Past Psychiatric History: Diagnosis:  Schizophrenia  Hospitalizations:  Cannot recall  Outpatient Care:  [facility]  Substance Abuse Care:  Denies, urine is positive for cocaine and Marijuana  Self-Mutilation:  Denies  Suicidal Attempts:  Denies  Violent Behaviors:  Denies   Objective: Blood pressure 130/79, pulse 99, temperature 98.5 F (36.9 C), temperature source Oral, resp. rate 16, SpO2 98.00%.There is no weight on file to calculate BMI. Results for orders placed during the hospital encounter of 08/16/12 (from the past 72 hour(s))  URINE RAPID DRUG SCREEN (HOSP PERFORMED)     Status: Abnormal   Collection Time    08/16/12 12:11 PM      Result Value Range   Opiates NONE DETECTED  NONE DETECTED   Cocaine POSITIVE (*) NONE DETECTED   Benzodiazepines NONE DETECTED  NONE DETECTED   Amphetamines NONE DETECTED  NONE DETECTED   Tetrahydrocannabinol POSITIVE (*) NONE DETECTED   Barbiturates NONE DETECTED  NONE DETECTED   Comment:            DRUG SCREEN FOR MEDICAL PURPOSES     ONLY.  IF CONFIRMATION IS NEEDED     FOR ANY PURPOSE, NOTIFY LAB     WITHIN 5 DAYS.  LOWEST DETECTABLE LIMITS     FOR URINE DRUG SCREEN     Drug Class       Cutoff (ng/mL)     Amphetamine      1000     Barbiturate      200     Benzodiazepine   200     Tricyclics       300     Opiates          300     Cocaine          300     THC              50  CBC     Status: Abnormal   Collection Time    08/16/12 12:15 PM      Result Value Range   WBC 5.3  4.0 - 10.5 K/uL   RBC 4.30  4.22 - 5.81 MIL/uL   Hemoglobin 12.3 (*) 13.0 - 17.0 g/dL   HCT 16.1 (*) 09.6 - 04.5 %   MCV 87.9  78.0 - 100.0 fL   MCH 28.6  26.0 - 34.0 pg   MCHC 32.5  30.0 - 36.0 g/dL   RDW 40.9  81.1 - 91.4 %   Platelets 198  150 - 400 K/uL  COMPREHENSIVE METABOLIC PANEL     Status: Abnormal    Collection Time    08/16/12 12:15 PM      Result Value Range   Sodium 141  135 - 145 mEq/L   Potassium 4.1  3.5 - 5.1 mEq/L   Chloride 106  96 - 112 mEq/L   CO2 25  19 - 32 mEq/L   Glucose, Bld 98  70 - 99 mg/dL   BUN 20  6 - 23 mg/dL   Creatinine, Ser 7.82  0.50 - 1.35 mg/dL   Calcium 9.4  8.4 - 95.6 mg/dL   Total Protein 6.8  6.0 - 8.3 g/dL   Albumin 4.1  3.5 - 5.2 g/dL   AST 24  0 - 37 U/L   ALT 15  0 - 53 U/L   Alkaline Phosphatase 61  39 - 117 U/L   Total Bilirubin 0.2 (*) 0.3 - 1.2 mg/dL   GFR calc non Af Amer >90  >90 mL/min   GFR calc Af Amer >90  >90 mL/min   Comment:            The eGFR has been calculated     using the CKD EPI equation.     This calculation has not been     validated in all clinical     situations.     eGFR's persistently     <90 mL/min signify     possible Chronic Kidney Disease.  ETHANOL     Status: None   Collection Time    08/16/12 12:15 PM      Result Value Range   Alcohol, Ethyl (B) <11  0 - 11 mg/dL   Comment:            LOWEST DETECTABLE LIMIT FOR     SERUM ALCOHOL IS 11 mg/dL     FOR MEDICAL PURPOSES ONLY   Labs are reviewed and are pertinent for positive Marijuana and cocaine use.  Current Facility-Administered Medications  Medication Dose Route Frequency Provider Last Rate Last Dose  . acetaminophen (TYLENOL) tablet 650 mg  650 mg Oral Q4H PRN Suzi Roots, MD      . LORazepam (ATIVAN)  tablet 1 mg  1 mg Oral Q8H PRN Suzi Roots, MD      . nicotine (NICODERM CQ - dosed in mg/24 hours) patch 21 mg  21 mg Transdermal Daily Suzi Roots, MD       Current Outpatient Prescriptions  Medication Sig Dispense Refill  . Paliperidone Palmitate (INVEGA SUSTENNA) 156 MG/ML SUSP Inject 156 mg into the muscle every 30 (thirty) days.        Psychiatric Specialty Exam:     Blood pressure 130/79, pulse 99, temperature 98.5 F (36.9 C), temperature source Oral, resp. rate 16, SpO2 98.00%.There is no weight on file to calculate BMI.   General Appearance: Casual  Eye Contact::  Poor  Speech:  Clear and Coherent  Volume:  Increased  Mood:  Depressed  Affect:  Congruent  Thought Process:  Linear  Orientation:  Full (Time, Place, and Person)  Thought Content:  WDL  Suicidal Thoughts:  No  Homicidal Thoughts:  No  Memory:  Immediate;   Poor Recent;   Poor Remote;   Poor  Judgement:  Poor  Insight:  Lacking  Psychomotor Activity:  Normal  Concentration:  Fair  Recall:  Poor  Akathisia:  NA  Handed:  Right  AIMS (if indicated):     Assets:  Desire for Improvement Financial Resources/Insurance Housing  Sleep:      Treatment Plan Summary: Daily contact with patient to assess and evaluate symptoms and progress in treatment Medication management Plan:  Consulted Dr Lolly Mustache Patient will be kept in [facility] overnight for reassessment by next provider We will provide him with his home medications.  His last Invega injection was 08/06/2012.  Next dose will be 8/8 Dahlia Byes, C  PMHNP-BC 08/16/2012 3:28 PM  I agreed with the findings and involved in the treatment plan. Kathryne Sharper, MD

## 2012-08-16 NOTE — BH Assessment (Signed)
Assessment Note  Erik Vargas is an 32 y.o. male who presents as cooperative and friendly.  Pt reports that he currently lives in a boarding home situation.  Pt reports that he can go back to this situation but doesn't want to because there is an infestation of bugs and spiders all over his room.  Pt states that all his family lives locally.  His stepmother was at pts bedside.  Pts father is his guardian.  Pt reports that sometimes he feels like his family is "turning against me and trying to take over stuff, if my name is on it, I should be able to run my own stuff".  Pt denies SI/HI/AH.  Pt reports that he sees "flashing lights out of the corner of my left eye", sometimes.  He reports that the lights aren't doing anything, just flashing.  Pt reports that he drinks beer and smokes marijuana, but only occasionally.  Pt reports he smoked and drank last yesterday.  Pt reports that he has a psychiatrist but doesn't remember the persons name, but he sees them like once a month.  Pt recently started on Invega injections from his doctor.  Pt reports that he has been hospitalized about 4 times for mood disorders.  Pt reports that he spent about two years at [facility] in [place] about 3 years ago.  Pt reports that he feels like he needs to be hospitalized again to get stable on his medication.   Pt reports that he had an ACT team but fired them cause he wanted to be on his own.  Pt reports that he thinks that he would like to have another ACT team.   Pt reports feeling anxious and nervous.  Pt reports that he finds himself not wanting to get out of bed sometimes, but that he does take a bath.  Pt reports that he doesn't isolate himself, but feels like"they (family & friends) are trying to get away from me".  Pt reports that he thinks he has an upcoming court date on 08/23/12 for "boosting returns", cause he "got mixed up with the wrong person, and they think I stole something".       Axis I: Bi-polar Disorder;  Schizophrenia Axis II: Deferred Axis III:  Past Medical History  Diagnosis Date  . Seizures   . Depression   . Schizophrenia   . Bipolar 1 disorder    Axis IV: economic problems, housing problems, occupational problems, other psychosocial or environmental problems, problems related to legal system/crime, problems related to social environment and problems with primary support group Axis V: 31-40 impairment in reality testing  Past Medical History:  Past Medical History  Diagnosis Date  . Seizures   . Depression   . Schizophrenia   . Bipolar 1 disorder     History reviewed. No pertinent past surgical history.  Family History: No family history on file.  Social History:  reports that he has been smoking Cigarettes.  He has a 10 pack-year smoking history. He does not have any smokeless tobacco history on file. He reports that  drinks alcohol. He reports that he uses illicit drugs (Marijuana).  Additional Social History:  Alcohol / Drug Use History of alcohol / drug use?: Yes Substance #1 Name of Substance 1: ETOH 1 - Age of First Use: 17 1 - Amount (size/oz): Beers 1 - Frequency: Weekends 1 - Duration: off and on 1 - Last Use / Amount: yesterday Substance #2 Name of Substance 2: Marijuana 2 -  Age of First Use: 17 2 - Amount (size/oz): unsure 2 - Frequency: off and on  2 - Duration: off and on 2 - Last Use / Amount: yesterday  CIWA: CIWA-Ar BP: 130/79 mmHg Pulse Rate: 99 COWS:    Allergies: No Known Allergies  Home Medications:  (Not in a hospital admission)  OB/GYN Status:  No LMP for male patient.  General Assessment Data Location of Assessment: [facility] Is this a Tele or Face-to-Face Assessment?: Face-to-Face Is this an Initial Assessment or a Re-assessment for this encounter?: Initial Assessment Living Arrangements: Alone Can pt return to current living arrangement?: Yes (Pt reports he doesn't like apt due insects everywhere) Admission Status: Voluntary Is  patient capable of signing voluntary admission?: No Transfer from: Home Referral Source: Self/Family/Friend     Risk to self Suicidal Ideation: No Suicidal Intent: No Is patient at risk for suicide?: No Suicidal Plan?: No Access to Means: No What has been your use of drugs/alcohol within the last 12 months?:  (last used yesterday) Previous Attempts/Gestures: No How many times?: 0 Other Self Harm Risks: None Triggers for Past Attempts: None known Intentional Self Injurious Behavior: None Family Suicide History: Unknown Recent stressful life event(s): Conflict (Comment);Legal Issues Persecutory voices/beliefs?: No Depression: Yes Depression Symptoms: Insomnia;Feeling worthless/self pity;Feeling angry/irritable Substance abuse history and/or treatment for substance abuse?: Yes  Risk to Others Homicidal Ideation: No Thoughts of Harm to Others: No Current Homicidal Intent: No Current Homicidal Plan: No Access to Homicidal Means: No Identified Victim: N/A History of harm to others?: No Does patient have access to weapons?: No Criminal Charges Pending?: Yes Describe Pending Criminal Charges:  (Pt says he's not sure, thinks larceny) Does patient have a court date: Yes Court Date: 08/23/12  Psychosis Hallucinations: Visual (Pt reports seeing a light outside his left eye sometimes) Delusions: None noted  Mental Status Report Appear/Hygiene: Disheveled Eye Contact: Fair Motor Activity: Freedom of movement;Restlessness Speech: Logical/coherent;Pressured Level of Consciousness: Alert;Restless Mood: Anxious;Helpless;Irritable;Sad Affect: Anxious;Sad Anxiety Level: Moderate Thought Processes: Coherent;Relevant;Irrelevant Judgement: Impaired Orientation: Person;Place;Time;Situation Obsessive Compulsive Thoughts/Behaviors: None  Cognitive Functioning Concentration: Decreased Memory: Recent Intact;Remote Intact IQ: Average Insight: Fair Impulse Control: Fair Appetite:  Good Weight Loss: 0 Weight Gain: 0 Sleep: Decreased Total Hours of Sleep: 5 Vegetative Symptoms: Staying in bed  ADLScreening [facility] Assessment Services) Patient's cognitive ability adequate to safely complete daily activities?: Yes Patient able to express need for assistance with ADLs?: Yes Independently performs ADLs?: Yes (appropriate for developmental age)  Prior Inpatient Therapy Prior Inpatient Therapy: Yes Prior Therapy Dates: 2011 Prior Therapy Facilty/Provider(s):  [facility] for two years. Doesnt remember others) Reason for Treatment: Bipolar  Prior Outpatient Therapy Prior Outpatient Therapy: Yes Prior Therapy Dates: Ongoing Prior Therapy Facilty/Provider(s):  (Pt can't remember names) Reason for Treatment: Bipolar  ADL Screening (condition at time of admission) Patient's cognitive ability adequate to safely complete daily activities?: Yes Patient able to express need for assistance with ADLs?: Yes Independently performs ADLs?: Yes (appropriate for developmental age)                  Additional Information Does patient have medical clearance?: Yes     Disposition:  Disposition Initial Assessment Completed for this Encounter: Yes Disposition of Patient: Referred to (Disposition pending  midlevel evaluation) Patient referred to: Other (Comment) (Disposition dependent on midlevel evalution)  On Site Evaluation by:   Reviewed with Physician:    Lexine Baton 08/16/2012 5:12 PM

## 2012-08-17 ENCOUNTER — Encounter (HOSPITAL_COMMUNITY): Payer: Self-pay | Admitting: Registered Nurse

## 2012-08-17 DIAGNOSIS — F209 Schizophrenia, unspecified: Secondary | ICD-10-CM | POA: Diagnosis present

## 2012-08-17 NOTE — Progress Notes (Signed)
Follow up Progress Note: Face to face interview and consult with   [name] [name]02/1982  Subjective:  Patient states that he was here at hospital to get medication for sore on his foot and a shot that would help him stop biting his nails.  Patient states that he sees psychiatrists and gets injection of Invega injection at [facility].  Patient denies suicidal ideations, homicidal ideations, or paranoia.  Patient states that he sees a flash of light face. Patient denies seeing people or any other visual hallucinations other than the flash of light.  Patient states that he is ready to go home and that he will keep his scheduled appointment with [name].      Current Medication Current facility-administered medications:acetaminophen (TYLENOL) tablet 650 mg, 650 mg, Oral, Q4H PRN, Suzi Roots, MD;  LORazepam (ATIVAN) tablet 1 mg, 1 mg, Oral, Q8H PRN, Suzi Roots, MD Current outpatient prescriptions:Paliperidone Palmitate (INVEGA SUSTENNA) 156 MG/ML SUSP, Inject 156 mg into the muscle every 30 (thirty) days., Disp: , Rfl:    Assessment @ Axis I: Schizophrenia Axis II: Schizophrenia Axis III:  Past Medical History  Diagnosis Date  . Seizures   . Depression   . Schizophrenia   . Bipolar 1 disorder    Axis IV: educational problems, housing problems, occupational problems and other psychosocial or environmental problems Axis V: 61-70 mild symptoms  Plan:  Face to face interview and consult with [name]  Recommendation:  Discharge home and follow up with [facility].  Make sure keep appointment for injection on 08/20/2012.  [name], FNP-BC

## 2012-08-17 NOTE — Progress Notes (Signed)
Patient seen, evaluated and plan formulated by me 

## 2012-08-17 NOTE — ED Provider Notes (Signed)
1:30 PM Psychiatry recommends discharge with continued [facility] management of meds, including invega injections.  He will follow up with [facility] today.    Candyce Churn, MD 08/17/12 [phone]

## 2012-09-28 ENCOUNTER — Encounter (HOSPITAL_COMMUNITY): Payer: Self-pay | Admitting: *Deleted

## 2012-09-28 ENCOUNTER — Emergency Department (HOSPITAL_COMMUNITY)
Admission: EM | Admit: 2012-09-28 | Discharge: 2012-09-28 | Disposition: A | Discharge status: Home / Self Care | Payer: Medicaid Other | Attending: Emergency Medicine | Admitting: Emergency Medicine

## 2012-09-28 DIAGNOSIS — F17200 Nicotine dependence, unspecified, uncomplicated: Secondary | ICD-10-CM | POA: Insufficient documentation

## 2012-09-28 DIAGNOSIS — F319 Bipolar disorder, unspecified: Secondary | ICD-10-CM | POA: Insufficient documentation

## 2012-09-28 DIAGNOSIS — F209 Schizophrenia, unspecified: Secondary | ICD-10-CM | POA: Insufficient documentation

## 2012-09-28 DIAGNOSIS — Z7982 Long term (current) use of aspirin: Secondary | ICD-10-CM | POA: Insufficient documentation

## 2012-09-28 DIAGNOSIS — F1910 Other psychoactive substance abuse, uncomplicated: Secondary | ICD-10-CM | POA: Insufficient documentation

## 2012-09-28 DIAGNOSIS — Z8669 Personal history of other diseases of the nervous system and sense organs: Secondary | ICD-10-CM | POA: Insufficient documentation

## 2012-09-28 LAB — CBC
HCT: 35.3 % — ABNORMAL LOW (ref 39.0–52.0)
MCHC: 33.7 g/dL (ref 30.0–36.0)
Platelets: 159 10*3/uL (ref 150–400)
RDW: 13.2 % (ref 11.5–15.5)

## 2012-09-28 LAB — COMPREHENSIVE METABOLIC PANEL WITH GFR
ALT: 13 U/L (ref 0–53)
AST: 27 U/L (ref 0–37)
Albumin: 3.9 g/dL (ref 3.5–5.2)
Alkaline Phosphatase: 59 U/L (ref 39–117)
BUN: 13 mg/dL (ref 6–23)
CO2: 23 meq/L (ref 19–32)
Calcium: 9 mg/dL (ref 8.4–10.5)
Chloride: 101 meq/L (ref 96–112)
Creatinine, Ser: 1.06 mg/dL (ref 0.50–1.35)
GFR calc Af Amer: >90 mL/min
GFR calc non Af Amer: >90 mL/min
Glucose, Bld: 85 mg/dL (ref 70–99)
Potassium: 3.7 meq/L (ref 3.5–5.1)
Sodium: 136 meq/L (ref 135–145)
Total Bilirubin: 0.4 mg/dL (ref 0.3–1.2)
Total Protein: 6.5 g/dL (ref 6.0–8.3)

## 2012-09-28 LAB — RAPID URINE DRUG SCREEN, HOSP PERFORMED
Amphetamines: NOT DETECTED
Barbiturates: NOT DETECTED
Benzodiazepines: NOT DETECTED
Cocaine: POSITIVE — AB
Opiates: NOT DETECTED
Tetrahydrocannabinol: POSITIVE — AB

## 2012-09-28 LAB — ETHANOL: Alcohol, Ethyl (B): <11 mg/dL (ref 0–11)

## 2012-09-28 NOTE — ED Notes (Signed)
Pt requesting detox from crack cocaine, last used it two hours ago. Denies any SI or HI.

## 2012-09-28 NOTE — ED Provider Notes (Addendum)
CSN: [id number]     Arrival date & time 09/28/12  1409 History   First MD Initiated Contact with Patient 09/28/12 1432     Chief Complaint  Patient presents with  . Medical Clearance    The history is provided by the patient and a relative.   PT PRESENTS FOR SUBSTANCE ABUSE ONSET - AWHILE AGO COURSE IS WORSENING STRESS WORSENS HIS SYMPTOMS  PT ADMITS TO FREQUENT CRACK ABUSE.  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  NO FEVER OR VOMITING IS REPORTED Past Medical History  Diagnosis Date  . Seizures   . Depression   . Schizophrenia   . Bipolar 1 disorder    History reviewed. No pertinent past surgical history. History reviewed. No pertinent family history. History  Substance Use Topics  . Smoking status: Current Every Day Smoker -- 2.00 packs/day for 5 years    Types: Cigarettes  . Smokeless tobacco: Not on file  . Alcohol Use: Yes     Comment: weekends    Review of Systems  Constitutional: Negative for fever.  Cardiovascular: Negative for chest pain.    Allergies  Review of patient's allergies indicates no known allergies.  Home Medications   Current Outpatient Rx  Name  Route  Sig  Dispense  Refill  . aspirin 325 MG tablet   Oral   Take 325 mg by mouth daily.         . Paliperidone Palmitate (INVEGA SUSTENNA) 156 MG/ML SUSP   Intramuscular   Inject 156 mg into the muscle every 30 (thirty) days.          BP 120/64  Pulse 85  Temp(Src) 98.8 F (37.1 C) (Oral)  Resp 18  SpO2 97% Physical Exam CONSTITUTIONAL: Well developed/well nourished HEAD: Normocephalic/atraumatic EYES: EOMI/PERRL ENMT: Mucous membranes moist NECK: supple no meningeal signs SPINE:entire spine nontender CV: S1/S2 noted, no murmurs/rubs/gallops noted LUNGS: Lungs are clear to auscultation bilaterally, no apparent distress ABDOMEN: soft, nontender, no rebound or guarding NEURO: Pt is awake/alert, moves all  extremitiesx4 EXTREMITIES: pulses normal, full ROM SKIN: warm, color normal PSYCH: flat affect  ED Course  Procedures (including critical care time) Labs Review Labs Reviewed  CBC - Abnormal; Notable for the following:    RBC 4.16 (*)    Hemoglobin 11.9 (*)    HCT 35.3 (*)    All other components within normal limits  COMPREHENSIVE METABOLIC PANEL  ETHANOL  URINE RAPID DRUG SCREEN (HOSP PERFORMED)    MDM   1. Substance abuse    Nursing notes including past medical history and social history reviewed and considered in documentation Labs/vital reviewed and considered  Pt not showing signs of acute withdrawal.  He is in no distress currently Arrangements have been made with [facility] on 9/22.   Pt otherwise stable for d/c home     Joya Gaskins, MD 09/28/12 1514  Joya Gaskins, MD 09/28/12 [phone]

## 2012-09-28 NOTE — Treatment Plan (Signed)
A screening appointment has been made for pt with Erik Vargas at [facility], [name], [place] on Monday Sept. 22nd at 0800.  Jennifer's direct line is [phone].

## 2012-10-31 ENCOUNTER — Emergency Department (HOSPITAL_COMMUNITY)
Admission: EM | Admit: 2012-10-31 | Discharge: 2012-10-31 | Disposition: A | Discharge status: Home / Self Care | Payer: Medicaid Other | Attending: Emergency Medicine | Admitting: Emergency Medicine

## 2012-10-31 ENCOUNTER — Encounter (HOSPITAL_COMMUNITY): Payer: Self-pay | Admitting: Emergency Medicine

## 2012-10-31 DIAGNOSIS — F419 Anxiety disorder, unspecified: Secondary | ICD-10-CM

## 2012-10-31 DIAGNOSIS — F17200 Nicotine dependence, unspecified, uncomplicated: Secondary | ICD-10-CM | POA: Insufficient documentation

## 2012-10-31 DIAGNOSIS — F319 Bipolar disorder, unspecified: Secondary | ICD-10-CM | POA: Insufficient documentation

## 2012-10-31 DIAGNOSIS — F411 Generalized anxiety disorder: Secondary | ICD-10-CM | POA: Insufficient documentation

## 2012-10-31 DIAGNOSIS — G40909 Epilepsy, unspecified, not intractable, without status epilepticus: Secondary | ICD-10-CM | POA: Insufficient documentation

## 2012-10-31 DIAGNOSIS — Z79899 Other long term (current) drug therapy: Secondary | ICD-10-CM | POA: Insufficient documentation

## 2012-10-31 DIAGNOSIS — F209 Schizophrenia, unspecified: Secondary | ICD-10-CM | POA: Insufficient documentation

## 2012-10-31 LAB — COMPREHENSIVE METABOLIC PANEL
ALT: 16 U/L (ref 0–53)
AST: 21 U/L (ref 0–37)
Albumin: 4.3 g/dL (ref 3.5–5.2)
Alkaline Phosphatase: 75 U/L (ref 39–117)
Chloride: 101 mEq/L (ref 96–112)
Creatinine, Ser: 1.16 mg/dL (ref 0.50–1.35)
GFR calc Af Amer: >90 mL/min (ref >90)
Glucose, Bld: 85 mg/dL (ref 70–99)
Potassium: 3.7 mEq/L (ref 3.5–5.1)
Sodium: 136 mEq/L (ref 135–145)
Total Bilirubin: 0.4 mg/dL (ref 0.3–1.2)

## 2012-10-31 LAB — CBC
MCV: 85.7 fL (ref 78.0–100.0)
Platelets: 185 10*3/uL (ref 150–400)
RDW: 12.8 % (ref 11.5–15.5)
WBC: 5.3 10*3/uL (ref 4.0–10.5)

## 2012-10-31 LAB — RAPID URINE DRUG SCREEN, HOSP PERFORMED
Amphetamines: NOT DETECTED
Barbiturates: NOT DETECTED
Benzodiazepines: NOT DETECTED
Opiates: NOT DETECTED
Tetrahydrocannabinol: POSITIVE — AB

## 2012-10-31 LAB — ACETAMINOPHEN LEVEL: Acetaminophen (Tylenol), Serum: <15 ug/mL (ref 10–30)

## 2012-10-31 LAB — SALICYLATE LEVEL: Salicylate Lvl: <2 mg/dL — ABNORMAL LOW (ref 2.8–20.0)
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
## 2012-11-17 ENCOUNTER — Encounter (HOSPITAL_COMMUNITY): Payer: Self-pay | Admitting: Emergency Medicine

## 2012-11-17 ENCOUNTER — Emergency Department (HOSPITAL_COMMUNITY)
Admission: EM | Admit: 2012-11-17 | Discharge: 2012-11-17 | Disposition: A | Discharge status: Home / Self Care | Payer: Medicaid Other | Attending: Emergency Medicine | Admitting: Emergency Medicine

## 2012-11-17 DIAGNOSIS — F1910 Other psychoactive substance abuse, uncomplicated: Secondary | ICD-10-CM

## 2012-11-17 DIAGNOSIS — R45851 Suicidal ideations: Secondary | ICD-10-CM

## 2012-11-17 DIAGNOSIS — F209 Schizophrenia, unspecified: Secondary | ICD-10-CM | POA: Insufficient documentation

## 2012-11-17 DIAGNOSIS — I10 Essential (primary) hypertension: Secondary | ICD-10-CM | POA: Insufficient documentation

## 2012-11-17 DIAGNOSIS — F1210 Cannabis abuse, uncomplicated: Secondary | ICD-10-CM | POA: Insufficient documentation

## 2012-11-17 DIAGNOSIS — F1410 Cocaine abuse, uncomplicated: Secondary | ICD-10-CM | POA: Insufficient documentation

## 2012-11-17 DIAGNOSIS — Z79899 Other long term (current) drug therapy: Secondary | ICD-10-CM | POA: Insufficient documentation

## 2012-11-17 DIAGNOSIS — F3130 Bipolar disorder, current episode depressed, mild or moderate severity, unspecified: Secondary | ICD-10-CM | POA: Insufficient documentation

## 2012-11-17 DIAGNOSIS — F919 Conduct disorder, unspecified: Secondary | ICD-10-CM | POA: Insufficient documentation

## 2012-11-17 DIAGNOSIS — Z8669 Personal history of other diseases of the nervous system and sense organs: Secondary | ICD-10-CM | POA: Insufficient documentation

## 2012-11-17 DIAGNOSIS — F17200 Nicotine dependence, unspecified, uncomplicated: Secondary | ICD-10-CM | POA: Insufficient documentation

## 2012-11-17 HISTORY — DX: Essential (primary) hypertension: I10

## 2012-11-17 LAB — CBC
MCH: 29.3 pg (ref 26.0–34.0)
MCV: 86.3 fL (ref 78.0–100.0)
Platelets: 184 10*3/uL (ref 150–400)
RDW: 13.2 % (ref 11.5–15.5)
WBC: 10.2 10*3/uL (ref 4.0–10.5)

## 2012-11-17 LAB — RAPID URINE DRUG SCREEN, HOSP PERFORMED
Amphetamines: NOT DETECTED
Cocaine: POSITIVE — AB
Opiates: NOT DETECTED
Tetrahydrocannabinol: POSITIVE — AB

## 2012-11-17 LAB — COMPREHENSIVE METABOLIC PANEL
AST: 21 U/L (ref 0–37)
Albumin: 4.2 g/dL (ref 3.5–5.2)
Calcium: 9.3 mg/dL (ref 8.4–10.5)
Creatinine, Ser: 1.07 mg/dL (ref 0.50–1.35)
GFR calc Af Amer: >90 mL/min (ref >90)
Potassium: 3.4 mEq/L — ABNORMAL LOW (ref 3.5–5.1)
Total Bilirubin: 0.3 mg/dL (ref 0.3–1.2)
Total Protein: 7 g/dL (ref 6.0–8.3)

## 2012-11-17 MED ORDER — ACETAMINOPHEN 325 MG PO TABS: 650.0000 mg | ORAL_TABLET | ORAL | Status: DC | PRN

## 2012-11-17 MED ORDER — IBUPROFEN 200 MG PO TABS
600.0000 mg | ORAL_TABLET | Freq: Three times a day (TID) | ORAL | Status: DC | PRN
  Administered 2012-11-17: 600 mg via ORAL
  Filled 2012-11-17: qty 3

## 2012-11-17 MED ORDER — ONDANSETRON HCL 4 MG PO TABS: 4.0000 mg | ORAL_TABLET | Freq: Three times a day (TID) | ORAL | Status: DC | PRN

## 2012-11-17 MED ORDER — ZOLPIDEM TARTRATE 5 MG PO TABS
5.0000 mg | ORAL_TABLET | Freq: Every evening | ORAL | Status: DC | PRN
Start: 2012-11-17 — End: 2012-11-17

## 2012-11-17 MED ORDER — NICOTINE 21 MG/24HR TD PT24: 21.0000 mg | MEDICATED_PATCH | Freq: Every day | TRANSDERMAL | Status: DC

## 2012-11-17 MED ORDER — LORAZEPAM 1 MG PO TABS
1.0000 mg | ORAL_TABLET | Freq: Three times a day (TID) | ORAL | Status: DC | PRN
  Administered 2012-11-17: 1 mg via ORAL
  Filled 2012-11-17: qty 1

## 2012-11-17 MED ORDER — ALUM & MAG HYDROXIDE-SIMETH 200-200-20 MG/5ML PO SUSP: 30.0000 mL | ORAL | Status: DC | PRN

## 2012-11-17 NOTE — ED Provider Notes (Signed)
Medical screening examination/treatment/procedure(s) were conducted as a shared visit with non-physician practitioner(s) and myself.  I personally evaluated the patient during the encounter.  Initially pt reported that he was actively suicidal, but has since recanted, and reports he is not suicidal.  He was given outpatient resources.  Olivia Mackie, MD 11/17/12 [phone]

## 2012-11-17 NOTE — ED Notes (Signed)
Patient presents requesting help with cocaine, alcohol and THC addiction. Patient states that he has been using drugs as long as he can remember and feels like if he doesn't get help he will never be able to stop; states that he wants to go into a rehab facility immediately; states that he had thoughts of wanting to cut himself earlier when he was using but denies any current thoughts of harm; denies any thoughts of wanting to harm others or any auditory or visual hallucinations. Patient complains of pain in neck and back from sleeping on the floor.

## 2012-11-17 NOTE — ED Notes (Signed)
Pt brought in by [employer] voluntarily  Pt states he has been using a lot of drugs lately  Pt states that he smokes weed and crack  Pt states he has been using 4 to 5 mths now and it has taken over his whole life  Pt states he was given $60 today and it was gone within 20 minutes  Pt states he wants to go to inpt therapy for help  Pt states tonight he was feeling suicidal so he called [employer] to bring him in for help

## 2012-11-17 NOTE — BH Assessment (Signed)
Assessment Note  Erik Vargas is a 32 y.o. male who presents to [facility] with passive SI and requesting detox from alcohol, crack and THC.  Pt has no plan or intent to harm self.   Pt reports the following: uses 2 "dimes" of crack/cocaine--"I use when I can"; drinks 1 case of alcohol daily, pt told this writer his last drink was 1 month ago, retracted this statement and stated he drank 2-16oz beers on 11/16/12.  Pt also uses 1 bag of THC, daily, last use was was 11/15/12, pt used 3 blunts.  Pt says he started using drugs 4-5 months ago, says he obtains money from his father for SA.  Pt says he has seizures and blackouts, but unable to remember last episode.  Pt has past inpt admissions for mental health or SA. Pt c/o w/d sxs: skin crawling.  Pt requested rehab for SA, this writer informed pt that referrals will be provided, pt can contact rehab facilities for tx.  This Clinical research associate contacted Dr. Norlene Campbell with disposition, pt will be d/c'd. Pt has pending legal charges--resisting an officer, court date set for 12/13/12   Axis I: Cannabis Abuse; Cocaine Abuse; Alcohol Dependence  Axis II: Deferred Axis III:  Past Medical History  Diagnosis Date  . Seizures   . Depression   . Schizophrenia   . Bipolar 1 disorder   . Hypertension    Axis IV: other psychosocial or environmental problems, problems related to legal system/crime, problems related to social environment and problems with primary support group Axis V: 51-60 moderate symptoms  Past Medical History:  Past Medical History  Diagnosis Date  . Seizures   . Depression   . Schizophrenia   . Bipolar 1 disorder   . Hypertension     History reviewed. No pertinent past surgical history.  Family History: History reviewed. No pertinent family history.  Social History:  reports that he has been smoking Cigarettes.  He has a 10 pack-year smoking history. He does not have any smokeless tobacco history on file. He reports that he drinks alcohol. He  reports that he uses illicit drugs (Marijuana, IV, and "Crack" cocaine).  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 Withdrawal Symptoms: Other (Comment) (skin crawling ) Substance #1 Name of Substance 1: Crack Cocaine  1 - Age of First Use: 32 YOM  1 - Amount (size/oz): 2 "dimes"  1 - Frequency: "I use when I can" 1 - Duration: 4-5 months  1 - Last Use / Amount: 11/16/12 Substance #2 Name of Substance 2: Alcohol  2 - Age of First Use: 32 YOM  2 - Amount (size/oz): 1 case  2 - Frequency: Daily  2 - Duration: 4-5 months  2 - Last Use / Amount: 1 month ago  Substance #3 Name of Substance 3: THC  3 - Age of First Use: 32 YOM  3 - Amount (size/oz): 1 Bag  3 - Frequency: Daily  3 - Duration: 4-5 mos 3 - Last Use / Amount: 11/15/12  CIWA: CIWA-Ar BP: 130/88 mmHg Pulse Rate: 92 COWS:    Allergies: No Known Allergies  Home Medications:  (Not in a hospital admission)  OB/GYN Status:  No LMP for male patient.  General Assessment Data Location of Assessment: [facility] ED Is this a Tele  or Face-to-Face Assessment?: Tele Assessment Is this an Initial Assessment or a Re-assessment for this encounter?: Initial Assessment Living Arrangements: Alone Can pt return to current living arrangement?: Yes Admission Status: Voluntary Is patient capable of signing voluntary admission?: Yes Transfer from: Acute Hospital Referral Source: MD  Medical Screening Exam [facility] Walk-in ONLY) Medical Exam completed: No Reason for MSE not completed: Other: (None )  [facility] Crisis Care Plan Living Arrangements: Alone Name of Psychiatrist: None  Name of Therapist: None   Education Status Is patient currently in school?: No Current Grade: None  Highest grade of school  patient has completed: None  Name of school: None  Contact person: None   Risk to self Suicidal Ideation: Yes-Currently Present Suicidal Intent: No Is patient at risk for suicide?: No Suicidal Plan?: No Access to Means: No What has been your use of drugs/alcohol within the last 12 months?: Abusing: Crack, Alcohol, THC  Previous Attempts/Gestures: No How many times?: 0 Other Self Harm Risks: None  Triggers for Past Attempts: None known Intentional Self Injurious Behavior: None Family Suicide History: No Recent stressful life event(s): Other (Comment);Legal Issues (Chronic SA) Persecutory voices/beliefs?: No Depression: Yes Depression Symptoms: Loss of interest in usual pleasures Substance abuse history and/or treatment for substance abuse?: Yes Suicide prevention information given to non-admitted patients: Not applicable  Risk to Others Homicidal Ideation: No Thoughts of Harm to Others: No Current Homicidal Intent: No Current Homicidal Plan: No Access to Homicidal Means: No Identified Victim: None  History of harm to others?: No Assessment of Violence: None Noted Violent Behavior Description: None  Does patient have access to weapons?: No Criminal Charges Pending?: Yes Describe Pending Criminal Charges: Resisting an Technical sales engineer  Does patient have a court date: Yes Court Date: 12/13/12  Psychosis Hallucinations: None noted Delusions: None noted  Mental Status Report Appear/Hygiene: Disheveled Eye Contact: Poor Motor Activity: Unremarkable Speech: Logical/coherent;Soft Level of Consciousness: Alert Mood: Sad Affect: Sad Anxiety Level: None Thought Processes: Coherent;Relevant Judgement: Unimpaired Orientation: Person;Place;Time;Situation Obsessive Compulsive Thoughts/Behaviors: None  Cognitive Functioning Concentration: Normal Memory: Recent Intact;Remote Intact IQ: Average Insight: Fair Impulse Control: Fair Appetite: Good Weight Loss: 0 Weight Gain:  0 Sleep: No Change Total Hours of Sleep: 6 Vegetative Symptoms: None  ADLScreening [facility] Assessment Services) Patient's cognitive ability adequate to safely complete daily activities?: Yes Patient able to express need for assistance with ADLs?: Yes Independently performs ADLs?: Yes (appropriate for developmental age)  Prior Inpatient Therapy Prior Inpatient Therapy: No Prior Therapy Dates: None  Prior Therapy Facilty/Provider(s): None  Reason for Treatment: None   Prior Outpatient Therapy Prior Outpatient Therapy: No Prior Therapy Dates: None  Prior Therapy Facilty/Provider(s): None  Reason for Treatment: None   ADL Screening (condition at time of admission) Patient's cognitive ability adequate to safely complete daily activities?: Yes Is the patient deaf or have difficulty hearing?: No Does the patient have difficulty seeing, even when wearing glasses/contacts?: No Does the patient have difficulty concentrating, remembering, or making decisions?: No Patient able to express need for assistance with ADLs?: Yes Does the patient have difficulty dressing or bathing?: No Independently performs ADLs?: Yes (appropriate for developmental age) Does the patient have difficulty walking or climbing stairs?: No Weakness of Legs: None Weakness of Arms/Hands: None  Home Assistive Devices/Equipment Home Assistive Devices/Equipment: None  Therapy Consults (therapy consults require a physician order) PT Evaluation Needed: No OT Evalulation Needed: No SLP Evaluation Needed: No Abuse/Neglect Assessment (Assessment to be complete while patient is alone) Physical Abuse: Denies Verbal Abuse: Denies Sexual  Abuse: Denies Exploitation of patient/patient's resources: Denies Self-Neglect: Denies Values / Beliefs Cultural Requests During Hospitalization: None Spiritual Requests During Hospitalization: None Consults Spiritual Care Consult Needed: No Social Work Consult Needed: No Dispensing optician (For Healthcare) Advance Directive: Patient does not have advance directive;Patient would not like information Pre-existing out of facility DNR order (yellow form or pink MOST form): No Nutrition Screen- [facility] Patient's home diet: Regular  Additional Information 1:1 In Past 12 Months?: No CIRT Risk: No Elopement Risk: No Does patient have medical clearance?: Yes     Disposition:  Disposition Initial Assessment Completed for this Encounter: Yes Disposition of Patient: Other dispositions (referals given for rehab tx ) Other disposition(s): Information only (Referrals provided for rehab tx )  On Site Evaluation by:   Reviewed with Physician:    Murrell Redden 11/17/2012 6:41 AM

## 2012-11-17 NOTE — ED Provider Notes (Signed)
CSN: [id number]     Arrival date & time 11/17/12  0126 History   First MD Initiated Contact with Patient 11/17/12 0127     Chief Complaint  Patient presents with  . Medical Clearance   (Consider location/radiation/quality/duration/timing/severity/associated sxs/prior Treatment) HPI Comments: Patient is a 32 year old male with a history of depression, schizophrenia, and bipolar disorder seeking assistance for his suicidal thought and substance abuse. Patient states that he has been spending all of his money on crack and marijuana and that he has been drinking heavily. Patient endorses drinking a 24 pack of beer today and smoking crack as well as marijuana. He states that when he is high he has thoughts of hurting himself. Patient denies any active suicidal plan at this time. He denies any suicide attempt. When asked explicitly of the patient will kill himself should he leave the emergency department he says "yes". Patient denies homicidal ideations.   The history is provided by the patient. No language interpreter was used.    Past Medical History  Diagnosis Date  . Seizures   . Depression   . Schizophrenia   . Bipolar 1 disorder   . Hypertension    History reviewed. No pertinent past surgical history. History reviewed. No pertinent family history. History  Substance Use Topics  . Smoking status: Current Every Day Smoker -- 2.00 packs/day for 5 years    Types: Cigarettes  . Smokeless tobacco: Not on file  . Alcohol Use: Yes     Comment: weekends    Review of Systems  Psychiatric/Behavioral: Positive for suicidal ideas and behavioral problems.  All other systems reviewed and are negative.    Allergies  Review of patient's allergies indicates no known allergies.  Home Medications   Current Outpatient Rx  Name  Route  Sig  Dispense  Refill  . Paliperidone Palmitate (INVEGA SUSTENNA) 156 MG/ML SUSP   Intramuscular   Inject 156 mg into the muscle every 30 (thirty) days.           BP 130/88  Pulse 92  Temp(Src) 97.9 F (36.6 C) (Oral)  Resp 20  SpO2 99%  Physical Exam  Nursing note and vitals reviewed. Constitutional: He is oriented to person, place, and time. He appears well-developed and well-nourished. No distress.  HENT:  Head: Normocephalic and atraumatic.  Mouth/Throat: Oropharynx is clear and moist. No oropharyngeal exudate.  Eyes: Conjunctivae and EOM are normal. Pupils are equal, round, and reactive to light. No scleral icterus.  Neck: Normal range of motion. Neck supple.  Cardiovascular: Normal rate, regular rhythm and normal heart sounds.   Pulmonary/Chest: Effort normal. No respiratory distress. He has no wheezes. He has no rales.  Musculoskeletal: Normal range of motion.  Neurological: He is alert and oriented to person, place, and time. GCS eye subscore is 4. GCS verbal subscore is 5. GCS motor subscore is 6.  Skin: Skin is warm and dry. No rash noted. He is not diaphoretic. No erythema. No pallor.  Psychiatric: His speech is normal. He is withdrawn. Cognition and memory are normal. He exhibits a depressed mood. He expresses suicidal ideation. He expresses no homicidal ideation. He expresses no suicidal plans and no homicidal plans.    ED Course  Procedures (including critical care time) Labs Review Labs Reviewed  CBC - Abnormal; Notable for the following:    Hemoglobin 12.6 (*)    HCT 37.1 (*)    All other components within normal limits  COMPREHENSIVE METABOLIC PANEL - Abnormal; Notable for the following:  Potassium 3.4 (*)    Glucose, Bld 103 (*)    All other components within normal limits  URINE RAPID DRUG SCREEN (HOSP PERFORMED) - Abnormal; Notable for the following:    Cocaine POSITIVE (*)    Tetrahydrocannabinol POSITIVE (*)    All other components within normal limits  ETHANOL   Imaging Review No results found.  EKG Interpretation   None       MDM   1. Polysubstance abuse   2. Passive suicidal ideations      Patient presents for polysubstance abuse and detox as well as suicidal thoughts. Patient with no suicidal plan. On initial presentation patient is not able to contract for safety; however, after being seen by behavioral health, contracts for safety. Denies that he will kill himself should he leave ED today. Patient with UDS positive for cocaine and THC; no ETOH and rest of labs unremarkable. Patient appropriate for d/c with resource guide and instruction to follow up with treatment facility for help with his polysubstance abuse.  Patient care discussed with my attending, Dr. Norlene Campbell, who is in agreement with this management plan and patient's stability for d/c.    Antony Madura, PA-C 11/17/12 [phone]

## 2012-11-17 NOTE — ED Notes (Signed)
Pt initially refused to leave claiming that if he left he would be killed. States some group of people are claiming he stole some drugs and the "word on the streets" is they are going to kill him. Encouraged pt to discuss his concerns with law enforcement and he is eligible for D/C per our medical team and he will need to leave the ER. Staff, security and [employer] escorted pt to exit where his belongings were returned. He refused to sign D/C paperwork and belongings sheet.

## 2012-11-21 ENCOUNTER — Emergency Department (HOSPITAL_COMMUNITY)
Admission: EM | Admit: 2012-11-21 | Discharge: 2012-11-23 | Disposition: A | Discharge status: Home / Self Care | Payer: Medicaid Other | Attending: Emergency Medicine | Admitting: Emergency Medicine

## 2012-11-21 DIAGNOSIS — Z8669 Personal history of other diseases of the nervous system and sense organs: Secondary | ICD-10-CM | POA: Insufficient documentation

## 2012-11-21 DIAGNOSIS — R5381 Other malaise: Secondary | ICD-10-CM | POA: Insufficient documentation

## 2012-11-21 DIAGNOSIS — F17200 Nicotine dependence, unspecified, uncomplicated: Secondary | ICD-10-CM | POA: Insufficient documentation

## 2012-11-21 DIAGNOSIS — F1910 Other psychoactive substance abuse, uncomplicated: Secondary | ICD-10-CM

## 2012-11-21 DIAGNOSIS — R45851 Suicidal ideations: Secondary | ICD-10-CM | POA: Insufficient documentation

## 2012-11-21 DIAGNOSIS — F1210 Cannabis abuse, uncomplicated: Secondary | ICD-10-CM | POA: Insufficient documentation

## 2012-11-21 DIAGNOSIS — F209 Schizophrenia, unspecified: Secondary | ICD-10-CM | POA: Insufficient documentation

## 2012-11-21 DIAGNOSIS — F1220 Cannabis dependence, uncomplicated: Secondary | ICD-10-CM

## 2012-11-21 DIAGNOSIS — F319 Bipolar disorder, unspecified: Secondary | ICD-10-CM | POA: Insufficient documentation

## 2012-11-21 DIAGNOSIS — F1010 Alcohol abuse, uncomplicated: Secondary | ICD-10-CM | POA: Diagnosis present

## 2012-11-21 DIAGNOSIS — Z79899 Other long term (current) drug therapy: Secondary | ICD-10-CM | POA: Insufficient documentation

## 2012-11-21 DIAGNOSIS — F1420 Cocaine dependence, uncomplicated: Secondary | ICD-10-CM

## 2012-11-21 DIAGNOSIS — I10 Essential (primary) hypertension: Secondary | ICD-10-CM | POA: Insufficient documentation

## 2012-11-21 DIAGNOSIS — F607 Dependent personality disorder: Secondary | ICD-10-CM | POA: Diagnosis present

## 2012-11-22 ENCOUNTER — Encounter (HOSPITAL_COMMUNITY): Payer: Self-pay | Admitting: Emergency Medicine

## 2012-11-22 DIAGNOSIS — F1420 Cocaine dependence, uncomplicated: Secondary | ICD-10-CM

## 2012-11-22 DIAGNOSIS — F1994 Other psychoactive substance use, unspecified with psychoactive substance-induced mood disorder: Secondary | ICD-10-CM

## 2012-11-22 DIAGNOSIS — F607 Dependent personality disorder: Secondary | ICD-10-CM | POA: Diagnosis present

## 2012-11-22 DIAGNOSIS — R45851 Suicidal ideations: Secondary | ICD-10-CM

## 2012-11-22 DIAGNOSIS — F259 Schizoaffective disorder, unspecified: Secondary | ICD-10-CM | POA: Diagnosis present

## 2012-11-22 DIAGNOSIS — F1010 Alcohol abuse, uncomplicated: Secondary | ICD-10-CM | POA: Diagnosis present

## 2012-11-22 DIAGNOSIS — F1220 Cannabis dependence, uncomplicated: Secondary | ICD-10-CM | POA: Diagnosis present

## 2012-11-22 LAB — CBC
HCT: 44.8 % (ref 39.0–52.0)
Hemoglobin: 14.8 g/dL (ref 13.0–17.0)
MCH: 28.6 pg (ref 26.0–34.0)
MCHC: 33 g/dL (ref 30.0–36.0)
MCV: 86.5 fL (ref 78.0–100.0)
Platelets: 197 10*3/uL (ref 150–400)
RBC: 5.18 MIL/uL (ref 4.22–5.81)

## 2012-11-22 LAB — COMPREHENSIVE METABOLIC PANEL
ALT: 14 U/L (ref 0–53)
Calcium: 9.8 mg/dL (ref 8.4–10.5)
Chloride: 103 mEq/L (ref 96–112)
Creatinine, Ser: 1.13 mg/dL (ref 0.50–1.35)
GFR calc Af Amer: >90 mL/min (ref >90)
Glucose, Bld: 73 mg/dL (ref 70–99)
Sodium: 142 mEq/L (ref 135–145)
Total Protein: 7.9 g/dL (ref 6.0–8.3)

## 2012-11-22 LAB — RAPID URINE DRUG SCREEN, HOSP PERFORMED
Amphetamines: NOT DETECTED
Benzodiazepines: NOT DETECTED
Opiates: NOT DETECTED

## 2012-11-22 LAB — ETHANOL: Alcohol, Ethyl (B): <11 mg/dL (ref 0–11)

## 2012-11-22 NOTE — ED Notes (Signed)
Assumed care of patient.

## 2012-11-22 NOTE — Treatment Plan (Signed)
No appropriate bed at [facility] today (11/10).  Please seek placement elsewhere.  Will reassess tomorrow if necessary.

## 2012-11-22 NOTE — ED Notes (Signed)
Patient resting in position of comfort with eyes closed RR WNL--even and unlabored with equal rise and fall of chest Patient in NAD Side rails up, call bell in reach  

## 2012-11-22 NOTE — ED Notes (Signed)
Pt states that d/t his parents not helping him he wants to kill himself.  Pt is homeless and asking to be placed in a facility so he can receive help for his SI.

## 2012-11-22 NOTE — Progress Notes (Signed)
Pt was seen with NP. Agree with assessment and plan. Will admit for safety/stabilization.

## 2012-11-22 NOTE — ED Notes (Signed)
Belongings placed in TCU locker # [phone]
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
## 2012-11-22 NOTE — Progress Notes (Signed)
Patient ID: Erik Vargas, male   DOB: May 27, 1980, 32 y.o.   MRN: [id number] Patient Identification:  [name] Date of Evaluation:  11/22/2012   History of Present Illness:  Patient came in to the ER c/o attempted to hurt himself last night by cutting self but on examination of the wrist no visible cut noted.  Patient then is endorsing suicide and states his homelessness is making him feel suicidal.  Patient states he attempted suicide  In the past by tieing a rope around his neck.  Patient is noted with flat affect and depressed mood, not cooperative and very vague and angry  When  answering questions.  He reports feeling hopeless, helpless and worthless.  Patient reports poor appetite and poor sleep.  He states he drinks until he passes out  but denies withdrawal seizures.  He denies previous detox treatment or diagnosis of any mental illness but it is documented patient does have Schizophrenia and substance abuse hx. We will admit patient for safety and stabilization.    Past Psychiatric History:Polysubstance dependence, Schizoaffective d/o.   Past Medical History:     Past Medical History  Diagnosis Date  . Seizures   . Depression   . Schizophrenia   . Bipolar 1 disorder   . Hypertension       History reviewed. No pertinent past surgical history.  Allergies: No Known Allergies  Current Medications:  Prior to Admission medications   Medication Sig Start Date End Date Taking? Authorizing Provider  Paliperidone Palmitate (INVEGA SUSTENNA) 156 MG/ML SUSP Inject 156 mg into the muscle every 30 (thirty) days.   Yes Historical Provider, MD    Social History:    reports that he has been smoking Cigarettes.  He has a 10 pack-year smoking history. He does not have any smokeless tobacco history on file. He reports that he drinks alcohol. He reports that he uses illicit drugs (Marijuana, IV, and "Crack" cocaine).   Family History:    No family history on file.  Mental  Status Examination/Evaluation:Psychiatric Specialty Exam: Physical Exam  ROS  Blood pressure 124/73, pulse 68, temperature 97.7 F (36.5 C), temperature source Oral, resp. rate 18, SpO2 99.00%.There is no weight on file to calculate BMI.  General Appearance: Casual and Disheveled  Eye Contact::  Absent  Speech:  Slow  Volume:  Decreased  Mood:  Angry, Anxious, Depressed, Dysphoric, Hopeless, Irritable and Worthless  Affect:  Congruent, Depressed and Flat  Thought Process:  Coherent and Intact  Orientation:  Full (Time, Place, and Person)  Thought Content:  Hallucinations: Auditory and UNABLE TO UNDERSTAND WHAT THE VOICES ARE TELLING HIM  Suicidal Thoughts:  Yes.  with intent/plan  Homicidal Thoughts:  No  Memory:  Immediate;   Poor Recent;   Poor Remote;   Poor  Judgement:  Poor  Insight:  Lacking and Shallow  Psychomotor Activity:  Normal  Concentration:  Fair  Recall:  NA  Akathisia:  NA  Handed:  Right  AIMS (if indicated):     Assets:  Desire for Improvement Housing  Sleep:          DIAGNOSIS:   AXIS I   Cocaine;THC dependencies;ETOH Abuse episodic;Schizoaffective schizophrenia;SIMD ;Suicidal gesture/threat   AXIS II  Deffered  AXIS III See medical notes.  AXIS IV economic problems, housing problems, occupational problems, other psychosocial or environmental problems, problems related to legal system/crime, problems related to social environment and problems with primary support group  AXIS V 21-30 behavior considerably influenced by delusions  or hallucinations OR serious impairment in judgment, communication OR inability to function in almost all areas     Assessment/Plan: Dahlia Byes  PMHNP-BC

## 2012-11-22 NOTE — ED Provider Notes (Signed)
CSN: [id number]     Arrival date & time 11/21/12  2354 History   First MD Initiated Contact with Patient 11/22/12 0016     Chief Complaint  Patient presents with  . Medical Clearance  . Suicidal   HPI  History provided by the patient. Patient is a 32 year old male with history of bipolar disorder, depression and hypertension presents with suicidal ideations. Patient reports feeling increased depression because his parents will not help take care of him. Patient states earlier he had a knife and the plan to cut his wrists when the police came and took the knife away. He states he is not feeling any significant improvements in also reports feeling tired and wants to sleep. Denies any HI. No hallucinations. Does admit to some recent drug use. Denies any alcohol use. No other aggravating or alleviating factors. She was in the emergency department several days ago with issues related to his polysubstance abuse.     Past Medical History  Diagnosis Date  . Seizures   . Depression   . Schizophrenia   . Bipolar 1 disorder   . Hypertension    History reviewed. No pertinent past surgical history. No family history on file. History  Substance Use Topics  . Smoking status: Current Every Day Smoker -- 2.00 packs/day for 5 years    Types: Cigarettes  . Smokeless tobacco: Not on file  . Alcohol Use: Yes     Comment: weekends    Review of Systems  Constitutional: Negative for fever, chills and diaphoresis.  Respiratory: Negative for shortness of breath.   Cardiovascular: Negative for chest pain.  Psychiatric/Behavioral: Positive for suicidal ideas and dysphoric mood. Negative for self-injury.  All other systems reviewed and are negative.    Allergies  Review of patient's allergies indicates no known allergies.  Home Medications   Current Outpatient Rx  Name  Route  Sig  Dispense  Refill  . Paliperidone Palmitate (INVEGA SUSTENNA) 156 MG/ML SUSP   Intramuscular   Inject 156 mg into  the muscle every 30 (thirty) days.          BP 116/66  Pulse 64  Temp(Src) 98.3 F (36.8 C) (Oral)  Resp 18  SpO2 96% Physical Exam  Nursing note and vitals reviewed. Constitutional: He is oriented to person, place, and time. He appears well-developed and well-nourished. No distress.  HENT:  Head: Normocephalic.  Cardiovascular: Normal rate and regular rhythm.   Pulmonary/Chest: Effort normal and breath sounds normal. No respiratory distress. He has no wheezes.  Neurological: He is alert and oriented to person, place, and time.  Skin: Skin is warm.  Psychiatric: His behavior is normal. He is not actively hallucinating. Thought content is not paranoid. He exhibits a depressed mood. He expresses suicidal ideation. He expresses no homicidal ideation. He expresses no suicidal plans.    ED Course  Procedures  COORDINATION OF CARE:  Nursing notes reviewed. Vital signs reviewed. Initial pt interview and examination performed.   1:12 AM-Discussed work up plan with pt at bedside, which includes medical clearance and TTS evaluation. Pt agrees with plan.  Psychiatric holding orders in place. TTS consult placed. Patient has been medically cleared and may proceed with psychiatric evaluation.    Results for orders placed during the hospital encounter of 11/21/12  CBC      Result Value Range   WBC 5.2  4.0 - 10.5 K/uL   RBC 5.18  4.22 - 5.81 MIL/uL   Hemoglobin 14.8  13.0 - 17.0 g/dL  HCT 44.8  39.0 - 52.0 %   MCV 86.5  78.0 - 100.0 fL   MCH 28.6  26.0 - 34.0 pg   MCHC 33.0  30.0 - 36.0 g/dL   RDW 45.4  09.8 - 11.9 %   Platelets 197  150 - 400 K/uL  COMPREHENSIVE METABOLIC PANEL      Result Value Range   Sodium 142  135 - 145 mEq/L   Potassium 3.5  3.5 - 5.1 mEq/L   Chloride 103  96 - 112 mEq/L   CO2 31  19 - 32 mEq/L   Glucose, Bld 73  70 - 99 mg/dL   BUN 15  6 - 23 mg/dL   Creatinine, Ser 1.47  0.50 - 1.35 mg/dL   Calcium 9.8  8.4 - 82.9 mg/dL   Total Protein 7.9  6.0 -  8.3 g/dL   Albumin 4.4  3.5 - 5.2 g/dL   AST 22  0 - 37 U/L   ALT 14  0 - 53 U/L   Alkaline Phosphatase 81  39 - 117 U/L   Total Bilirubin 0.2 (*) 0.3 - 1.2 mg/dL   GFR calc non Af Amer 86 (*) >90 mL/min   GFR calc Af Amer >90  >90 mL/min  ETHANOL      Result Value Range   Alcohol, Ethyl (B) <11  0 - 11 mg/dL  URINE RAPID DRUG SCREEN (HOSP PERFORMED)      Result Value Range   Opiates NONE DETECTED  NONE DETECTED   Cocaine POSITIVE (*) NONE DETECTED   Benzodiazepines NONE DETECTED  NONE DETECTED   Amphetamines NONE DETECTED  NONE DETECTED   Tetrahydrocannabinol POSITIVE (*) NONE DETECTED   Barbiturates NONE DETECTED  NONE DETECTED        MDM   1. Suicidal ideation   2. Polysubstance abuse        Angus Seller, PA-C 11/22/12 0134

## 2012-11-22 NOTE — ED Notes (Signed)
Assumed care of patient Patient resting in position of comfort with eyes closed RR WNL--even and unlabored with equal rise and fall of chest Patient in NAD Side rails up, call bell in reach  

## 2012-11-22 NOTE — ED Provider Notes (Signed)
Medical screening examination/treatment/procedure(s) were performed by non-physician practitioner and as supervising physician I was immediately available for consultation/collaboration.  Casilda Pickerill, MD 11/22/12 0447 

## 2012-11-22 NOTE — ED Notes (Signed)
States that he wants to hurt himself, had a knife at wrist tonight, knife was taken away by police, pt states he still wants to hurt self, no HI

## 2012-11-23 ENCOUNTER — Encounter (HOSPITAL_COMMUNITY): Payer: Self-pay | Admitting: Registered Nurse

## 2012-11-23 NOTE — Consult Note (Signed)
  Face to Face Follow up Interview/Consult with Dr. Ladona Ridgel  Subject:  Patient states that he was a little depressed because he had been calling his mother all day and sh would answer her phone.  "At home I was feel some type a way a little depressed.  I was just feeling ignored.  I did not cut my wrist."  Patient states "I was going to try and cut my wrist but somebody stop me and called the police.  Patient states that he is compliant with his medication and has ACT team.  Patient denies suicidal/homicidal ideation, psychosis, and paranoia.    CW spoke with ACT team member and informed that patient had last Invaga injection 11/05/2012 and is due another in one week. Unable to come to hospital to visit patient.    Patient states that he can follow up with [facility] and ACT team.  Patient states that he has to go to work at 4 pm and is able to contract for safety.   Psychiatric Specialty Exam: Physical Exam  ROS  Blood pressure 115/63, pulse 71, temperature 97.8 F (36.6 C), temperature source Oral, resp. rate 16, SpO2 99.00%.There is no weight on file to calculate BMI.  General Appearance: Casual  Eye Contact::  Good  Speech:  Clear and Coherent and Normal Rate  Volume:  Normal  Mood:  Depressed  Affect:  Depressed  Thought Process:  Circumstantial and Goal Directed  Orientation:  Full (Time, Place, and Person)  Thought Content:  Rumination  Suicidal Thoughts:  No  Homicidal Thoughts:  No  Memory:  Immediate;   Good Recent;   Good  Judgement:  Fair  Insight:  Present  Psychomotor Activity:  Normal  Concentration:  Fair  Recall:  Good  Akathisia:  No  Handed:  Right  AIMS (if indicated):     Assets:  Communication Skills Desire for Improvement  Sleep:      Disposition:  Discharge home to follow up with primary Vesta Mixer) and ACT Team.  Denice Bors B. Maksim Peregoy FNP-BC Family Nurse Practitioner, Board Certified

## 2012-11-23 NOTE — Progress Notes (Addendum)
Writer spoke to the patients ACTT Team receptionist [phone]) regarding the patients last injections date of his medication.  Writer was informed that the patients last injection of Invega 117mg  was given on November 05, 2012.   Writer informed the ACTT Team that the patient does not meet criteria for inpatient hospitalization per the Psychiatrist (Dr. Ladona Ridgel) and NP Denice Bors)   Writer informed the ER MD (Dr. Effie Shy) and nurse working with the patient that the patient will be discharged and will he will follow up with his ACTT Team.

## 2012-11-23 NOTE — Progress Notes (Signed)
Prior to 11/23/12 discharge CM spoke with pt who confirms [employer] resident with no pcp. CM discussed and provided written information for medicaid pcps, importance of pcp for f/u care, www.needymeds.org, discounted pharmacies and other [place] resources such as financial assistance, DSS and  health department  Reviewed resources for [employer] pcps like [facility], family medicine at [address], [facility] family practice, general medical clinics, [facility] urgent care plus others, [facility] out patient pharmacies and housing Pt voiced understanding and appreciation of resources provided   Provided [facility] contact information

## 2012-11-24 NOTE — Consult Note (Signed)
Note reviewed and agreed with  

## 2013-01-02 ENCOUNTER — Emergency Department (HOSPITAL_COMMUNITY)
Admission: EM | Admit: 2013-01-02 | Discharge: 2013-01-03 | Disposition: A | Discharge status: Home / Self Care | Payer: Medicaid Other | Attending: Emergency Medicine | Admitting: Emergency Medicine

## 2013-01-02 ENCOUNTER — Encounter (HOSPITAL_COMMUNITY): Payer: Self-pay | Admitting: Emergency Medicine

## 2013-01-02 DIAGNOSIS — F209 Schizophrenia, unspecified: Secondary | ICD-10-CM | POA: Insufficient documentation

## 2013-01-02 DIAGNOSIS — F3130 Bipolar disorder, current episode depressed, mild or moderate severity, unspecified: Secondary | ICD-10-CM | POA: Insufficient documentation

## 2013-01-02 DIAGNOSIS — F1410 Cocaine abuse, uncomplicated: Secondary | ICD-10-CM | POA: Insufficient documentation

## 2013-01-02 DIAGNOSIS — Z79899 Other long term (current) drug therapy: Secondary | ICD-10-CM | POA: Insufficient documentation

## 2013-01-02 DIAGNOSIS — Z8669 Personal history of other diseases of the nervous system and sense organs: Secondary | ICD-10-CM | POA: Insufficient documentation

## 2013-01-02 DIAGNOSIS — F17200 Nicotine dependence, unspecified, uncomplicated: Secondary | ICD-10-CM | POA: Insufficient documentation

## 2013-01-02 DIAGNOSIS — I10 Essential (primary) hypertension: Secondary | ICD-10-CM | POA: Insufficient documentation

## 2013-01-02 DIAGNOSIS — F1010 Alcohol abuse, uncomplicated: Secondary | ICD-10-CM | POA: Insufficient documentation

## 2013-01-02 LAB — CBC
HCT: 39.4 % (ref 39.0–52.0)
MCHC: 33.2 g/dL (ref 30.0–36.0)
MCV: 85.7 fL (ref 78.0–100.0)
RBC: 4.6 MIL/uL (ref 4.22–5.81)
RDW: 12.9 % (ref 11.5–15.5)
WBC: 5.7 10*3/uL (ref 4.0–10.5)

## 2013-01-02 LAB — COMPREHENSIVE METABOLIC PANEL
Albumin: 4.2 g/dL (ref 3.5–5.2)
BUN: 14 mg/dL (ref 6–23)
CO2: 27 mEq/L (ref 19–32)
Chloride: 101 mEq/L (ref 96–112)
Creatinine, Ser: 1.05 mg/dL (ref 0.50–1.35)
GFR calc Af Amer: >90 mL/min (ref >90)
GFR calc non Af Amer: >90 mL/min (ref >90)
Total Bilirubin: 0.4 mg/dL (ref 0.3–1.2)
Total Protein: 7.2 g/dL (ref 6.0–8.3)

## 2013-01-02 LAB — SALICYLATE LEVEL: Salicylate Lvl: <2 mg/dL — ABNORMAL LOW (ref 2.8–20.0)

## 2013-01-02 LAB — RAPID URINE DRUG SCREEN, HOSP PERFORMED: Benzodiazepines: NOT DETECTED

## 2013-01-02 LAB — ETHANOL: Alcohol, Ethyl (B): <11 mg/dL (ref 0–11)

## 2013-01-02 MED ORDER — LORAZEPAM 1 MG PO TABS: 0.0000 mg | ORAL_TABLET | Freq: Four times a day (QID) | ORAL | Status: DC

## 2013-01-02 MED ORDER — VITAMIN B-1 100 MG PO TABS
100.0000 mg | ORAL_TABLET | Freq: Every day | ORAL | Status: DC
  Administered 2013-01-02: 16:00:00 100 mg via ORAL
  Filled 2013-01-02: qty 1

## 2013-01-02 MED ORDER — LORAZEPAM 1 MG PO TABS: 0.0000 mg | ORAL_TABLET | Freq: Two times a day (BID) | ORAL | Status: DC

## 2013-01-02 MED ORDER — THIAMINE HCL 100 MG/ML IJ SOLN: 100.0000 mg | Freq: Every day | INTRAMUSCULAR | Status: DC

## 2013-01-02 NOTE — ED Provider Notes (Signed)
CSN: [id number]     Arrival date & time 01/02/13  1421 History  This chart was scribed for non-physician practitioner Santiago Glad, PA-C, working with Nelia Shi, MD by Dorothey Baseman, ED Scribe. This patient was seen in room [facility]4/[facility]4 and the patient's care was started at 2:52 PM.     Chief Complaint  Patient presents with  . Mental Health Problem   The history is provided by the patient. No language interpreter was used.   HPI Comments: Erik Vargas is a 32 y.o. male with a history of depression, schizophrenia, and bipolar I disorder who presents to the Emergency Department requesting in-patient rehabilitation for alcohol and marijuana use. Patient reports that he usually drinks a 12 pack of beer daily.   He reports that his last drink was yesterday around 6:30 PM, around 20 hours ago. Patient reports that he usually uses marijuana during the week days, but not the weekends. Patient reports some associated, intermittent headaches and chills. Patient reports that his parents have been encouraging him to get involved in a program and that he does want to get help for his drug abuse problems. He states that he has been in a rehabilitation program before, but that it was not helpful in the long-term. He denies history of alcohol withdrawal symptoms.  He denies suicidal or homicidal ideation, tremors, nausea. Patient also has a history of seizures and HTN.   Past Medical History  Diagnosis Date  . Seizures   . Depression   . Schizophrenia   . Bipolar 1 disorder   . Hypertension    History reviewed. No pertinent past surgical history. History reviewed. No pertinent family history. History  Substance Use Topics  . Smoking status: Current Every Day Smoker -- 2.00 packs/day for 5 years    Types: Cigarettes  . Smokeless tobacco: Not on file  . Alcohol Use: Yes     Comment: weekends    Review of Systems  A complete 10 system review of systems was obtained and all systems are  negative except as noted in the HPI and PMH.   Allergies  Review of patient's allergies indicates no known allergies.  Home Medications   Current Outpatient Rx  Name  Route  Sig  Dispense  Refill  . Paliperidone Palmitate (INVEGA SUSTENNA) 156 MG/ML SUSP   Intramuscular   Inject 156 mg into the muscle every 30 (thirty) days.          Triage Vitals: BP 133/82  Pulse 98  Temp(Src) 98.5 F (36.9 C) (Oral)  Resp 16  SpO2 100%  Physical Exam  Nursing note and vitals reviewed. Constitutional: He is oriented to person, place, and time. He appears well-developed and well-nourished. No distress.  HENT:  Head: Normocephalic and atraumatic.  Eyes: Conjunctivae are normal.  Neck: Normal range of motion. Neck supple.  Cardiovascular: Normal rate, regular rhythm and normal heart sounds.   Pulmonary/Chest: Effort normal and breath sounds normal. No respiratory distress.  Abdominal: He exhibits no distension.  Musculoskeletal: Normal range of motion.  Neurological: He is alert and oriented to person, place, and time.  Skin: Skin is warm and dry.  Psychiatric: He has a normal mood and affect. His behavior is normal.    ED Course  Procedures (including critical care time)  DIAGNOSTIC STUDIES: Oxygen Saturation is 100% on room air, normal by my interpretation.    COORDINATION OF CARE: 2:56 PM- Discussed that the patient will be moved to a different area of the ED.  Discussed that the ACT team will consult with the patient. Discussed treatment plan with patient at bedside and patient verbalized agreement.     Labs Review Labs Reviewed  ACETAMINOPHEN LEVEL  CBC  COMPREHENSIVE METABOLIC PANEL  ETHANOL  SALICYLATE LEVEL  URINE RAPID DRUG SCREEN (HOSP PERFORMED)   Imaging Review No results found.  EKG Interpretation   None       MDM  No diagnosis found. Patient presents today requesting alcohol detox.  He is also requesting inpatient treatment for alcoholism.  Denies SI  or HI at this time.  Labs unremarkable.  TTS consulted.  I personally performed the services described in this documentation, which was scribed in my presence. The recorded information has been reviewed and is accurate.    Santiago Glad, PA-C 01/02/13 1952

## 2013-01-02 NOTE — ED Notes (Signed)
Patient arrived to unit; no s/s of distress noted. Pt pleasant and cooperative with care. Pt eating meal. Pt denies SI/HI or a/v hallucinations.

## 2013-01-02 NOTE — ED Notes (Signed)
He tells me he needs inpatient "rehab." for ETOH; marijuana and crack abuse.  He tells me he has a history of depression; and is no currently having thoughts of s.i. Or h.i.

## 2013-01-02 NOTE — ED Notes (Addendum)
Patient and belongings have been wanded by security. Patient belongings are black jacket, grey shirt, grey jacket, blue jeans, sneakers, underware, socks.

## 2013-01-02 NOTE — ED Notes (Signed)
Pt only moved to TCU until bed available in Behavior Holding Unit

## 2013-01-03 NOTE — BH Assessment (Signed)
Assessment Note  Erik Vargas is a 32 y.o. male who presents to [facility] for alcohol, marijuana and cocaine/crack detox.  Pt denies SI/HI/AVH.  Pt has no hx of detox admissions and told this writer that he wanted admission to a hospital so he was able to receive his invega im injection.  Pt states he drinks a 12 pk of beer, daily, unable to tell this writer when his last intake was and BAL is negative, pt also uses 3-4 blunts, daily of marijuana, last use was 1 week ago. Pt admits he uses pills, but he was unable to specify frequency or amount.  Pt has no current w/d sxs.  This Clinical research associate informed pt that detox is not avail at this time, because there is no alcohol present in system and pt stated that he had a drink "in a while". There is not detox protocol for marijuana and cocaine.  Referrals provided for rehab and pt was d/c'd home.    Axis I: Alcohol Abuse, Cocaine Abuse, Cannabis Abuse  Axis II: Deferred Axis III:  Past Medical History  Diagnosis Date  . Seizures   . Depression   . Schizophrenia   . Bipolar 1 disorder   . Hypertension    Axis IV: other psychosocial or environmental problems, problems related to social environment and problems with primary support group Axis V: 51-60 moderate symptoms  Past Medical History:  Past Medical History  Diagnosis Date  . Seizures   . Depression   . Schizophrenia   . Bipolar 1 disorder   . Hypertension     History reviewed. No pertinent past surgical history.  Family History: History reviewed. No pertinent family history.  Social History:  reports that he has been smoking Cigarettes.  He has a 10 pack-year smoking history. He does not have any smokeless tobacco history on file. He reports that he drinks alcohol. He reports that he uses illicit drugs (Marijuana, IV, and "Crack" cocaine).  Additional Social History:  Alcohol / Drug Use Pain Medications: None  Prescriptions: Invega  Over the Counter: None  Longest period of sobriety  (when/how long): None  Negative Consequences of Use: Personal relationships;Financial;Work / School Withdrawal Symptoms: Other (Comment) (No current w/d sxs ) Substance #1 Name of Substance 1: Alcohol  1 - Age of First Use: Teens  1 - Amount (size/oz): 12 pk  1 - Frequency: Daily  1 - Duration: On-going  1 - Last Use / Amount: "It's been awhile"  Substance #2 Name of Substance 2: THC  2 - Age of First Use: Teens  2 - Amount (size/oz): 3-4 Blunts  2 - Frequency: Daily  2 - Duration: On-going  2 - Last Use / Amount: 1 week ago  Substance #3 Name of Substance 3: Crack  3 - Age of First Use: Unk  3 - Amount (size/oz): Unk  3 - Frequency: Unk  3 - Duration: On-going  3 - Last Use / Amount: Unk   CIWA: CIWA-Ar BP: 138/74 mmHg Pulse Rate: 96 Nausea and Vomiting: no nausea and no vomiting Tactile Disturbances: none Tremor: no tremor Auditory Disturbances: not present Paroxysmal Sweats: no sweat visible Visual Disturbances: not present Anxiety: no anxiety, at ease Headache, Fullness in Head: none present Agitation: normal activity Orientation and Clouding of Sensorium: oriented and can do serial additions CIWA-Ar Total: 0 COWS:    Allergies: No Known Allergies  Home Medications:  (Not in a hospital admission)  OB/GYN Status:  No LMP for male patient.  General  Assessment Data Location of Assessment: [facility] Is this a Tele or Face-to-Face Assessment?: Face-to-Face Is this an Initial Assessment or a Re-assessment for this encounter?: Initial Assessment Living Arrangements: Alone Can pt return to current living arrangement?: Yes Admission Status: Voluntary Is patient capable of signing voluntary admission?: Yes Transfer from: Acute Hospital Referral Source: MD  Medical Screening Exam [facility] Walk-in ONLY) Medical Exam completed: No Reason for MSE not completed: Other: (None )  [facility] Crisis Care Plan Living Arrangements: Alone Name of Psychiatrist: None  Name of  Therapist: None   Education Status Is patient currently in school?: No Current Grade: None  Highest grade of school patient has completed: None  Name of school: None  Contact person: None   Risk to self Suicidal Ideation: No Suicidal Intent: No Is patient at risk for suicide?: No Suicidal Plan?: No Access to Means: No What has been your use of drugs/alcohol within the last 12 months?: Abusing: alcohol, crack, thc  Previous Attempts/Gestures: No How many times?: 0 Other Self Harm Risks: None  Triggers for Past Attempts: None known Intentional Self Injurious Behavior: None Family Suicide History: No Recent stressful life event(s): Other (Comment) (Chronic SA ) Persecutory voices/beliefs?: No Depression: Yes Depression Symptoms: Feeling angry/irritable Substance abuse history and/or treatment for substance abuse?: Yes Suicide prevention information given to non-admitted patients: Not applicable  Risk to Others Homicidal Ideation: No Thoughts of Harm to Others: No Current Homicidal Intent: No Current Homicidal Plan: No Access to Homicidal Means: No Identified Victim: None  History of harm to others?: No Assessment of Violence: None Noted Violent Behavior Description: None  Does patient have access to weapons?: No Criminal Charges Pending?: No Describe Pending Criminal Charges: None  Does patient have a court date: No Court Date:  (None )  Psychosis Hallucinations: None noted Delusions: None noted  Mental Status Report Appear/Hygiene: Disheveled;Poor hygiene Eye Contact: Poor Motor Activity: Unremarkable;Freedom of movement Speech: Logical/coherent Level of Consciousness: Irritable;Alert Mood: Irritable Affect: Irritable Anxiety Level: None Thought Processes: Relevant;Coherent Judgement: Unimpaired Orientation: Person;Place;Situation;Time Obsessive Compulsive Thoughts/Behaviors: None  Cognitive Functioning Concentration: Normal Memory: Recent Intact;Remote  Intact IQ: Average Insight: Fair Impulse Control: Fair Appetite: Good Weight Loss: 0 Weight Gain: 0 Sleep: No Change Total Hours of Sleep: 7 Vegetative Symptoms: None  ADLScreening [facility] Assessment Services) Patient's cognitive ability adequate to safely complete daily activities?: Yes Patient able to express need for assistance with ADLs?: Yes Independently performs ADLs?: Yes (appropriate for developmental age)  Prior Inpatient Therapy Prior Inpatient Therapy: No Prior Therapy Dates: None  Prior Therapy Facilty/Provider(s): None  Reason for Treatment: None   Prior Outpatient Therapy Prior Outpatient Therapy: No Prior Therapy Dates: None  Prior Therapy Facilty/Provider(s): None  Reason for Treatment: None   ADL Screening (condition at time of admission) Patient's cognitive ability adequate to safely complete daily activities?: Yes Is the patient deaf or have difficulty hearing?: No Does the patient have difficulty seeing, even when wearing glasses/contacts?: No Does the patient have difficulty concentrating, remembering, or making decisions?: No Patient able to express need for assistance with ADLs?: Yes Does the patient have difficulty dressing or bathing?: No Independently performs ADLs?: Yes (appropriate for developmental age) Does the patient have difficulty walking or climbing stairs?: No Weakness of Legs: None Weakness of Arms/Hands: None  Home Assistive Devices/Equipment Home Assistive Devices/Equipment: None  Therapy Consults (therapy consults require a physician order) PT Evaluation Needed: No OT Evalulation Needed: No SLP Evaluation Needed: No Abuse/Neglect Assessment (Assessment to be complete while patient is  alone) Physical Abuse: Denies Verbal Abuse: Denies Sexual Abuse: Denies Exploitation of patient/patient's resources: Denies Self-Neglect: Denies Values / Beliefs Cultural Requests During Hospitalization: None Spiritual Requests During  Hospitalization: None Consults Spiritual Care Consult Needed: No Social Work Consult Needed: No Merchant navy officer (For Healthcare) Advance Directive: Patient does not have advance directive;Patient would not like information Pre-existing out of facility DNR order (yellow form or pink MOST form): No Nutrition Screen- [facility] Adult/[facility] Patient's home diet: Regular  Additional Information 1:1 In Past 12 Months?: No CIRT Risk: No Elopement Risk: No Does patient have medical clearance?: Yes     Disposition:  Disposition Initial Assessment Completed for this Encounter: Yes Disposition of Patient: Outpatient treatment;Other dispositions (Referrals provided for outpt treatment ) Type of outpatient treatment: Adult Other disposition(s): Information only;Other (Comment) (Referrals provided for outpt services )  On Site Evaluation by:   Reviewed with Physician:    Murrell Redden 01/03/2013 12:58 AM

## 2013-01-03 NOTE — ED Provider Notes (Signed)
Pt has been assessed by TSS who states he can be discharged for outpatient treatment. Pt is sleeping, easily awakened. Is agreeable to outpatient treatment.    Devoria Albe, MD, Armando Gang   Ward Givens, MD 01/03/13 [phone]

## 2013-01-09 NOTE — ED Provider Notes (Signed)
Medical screening examination/treatment/procedure(s) were performed by non-physician practitioner and as supervising physician I was immediately available for consultation/collaboration.   Orian Amberg L Graesyn Schreifels, MD 01/09/13 2102 

## 2013-03-22 ENCOUNTER — Encounter (HOSPITAL_COMMUNITY): Payer: Self-pay | Admitting: Emergency Medicine

## 2013-03-22 ENCOUNTER — Emergency Department (HOSPITAL_COMMUNITY): Payer: Medicaid Other

## 2013-03-22 ENCOUNTER — Emergency Department (EMERGENCY_DEPARTMENT_HOSPITAL)
Admission: EM | Admit: 2013-03-22 | Discharge: 2013-03-23 | Disposition: A | Discharge status: Home / Self Care | Payer: Medicaid Other | Source: Home / Self Care

## 2013-03-22 ENCOUNTER — Emergency Department (HOSPITAL_COMMUNITY)
Admission: EM | Admit: 2013-03-22 | Discharge: 2013-03-22 | Discharge status: Against Medical Advice | Payer: Medicaid Other | Attending: Emergency Medicine | Admitting: Emergency Medicine

## 2013-03-22 DIAGNOSIS — G8911 Acute pain due to trauma: Secondary | ICD-10-CM

## 2013-03-22 DIAGNOSIS — Z8669 Personal history of other diseases of the nervous system and sense organs: Secondary | ICD-10-CM

## 2013-03-22 DIAGNOSIS — Z23 Encounter for immunization: Secondary | ICD-10-CM

## 2013-03-22 DIAGNOSIS — F1210 Cannabis abuse, uncomplicated: Secondary | ICD-10-CM

## 2013-03-22 DIAGNOSIS — S199XXA Unspecified injury of neck, initial encounter: Secondary | ICD-10-CM

## 2013-03-22 DIAGNOSIS — R51 Headache: Secondary | ICD-10-CM

## 2013-03-22 DIAGNOSIS — S01511A Laceration without foreign body of lip, initial encounter: Secondary | ICD-10-CM

## 2013-03-22 DIAGNOSIS — F209 Schizophrenia, unspecified: Secondary | ICD-10-CM

## 2013-03-22 DIAGNOSIS — S01501A Unspecified open wound of lip, initial encounter: Secondary | ICD-10-CM | POA: Insufficient documentation

## 2013-03-22 DIAGNOSIS — F17200 Nicotine dependence, unspecified, uncomplicated: Secondary | ICD-10-CM | POA: Insufficient documentation

## 2013-03-22 DIAGNOSIS — Z8659 Personal history of other mental and behavioral disorders: Secondary | ICD-10-CM

## 2013-03-22 DIAGNOSIS — F259 Schizoaffective disorder, unspecified: Secondary | ICD-10-CM

## 2013-03-22 DIAGNOSIS — F1910 Other psychoactive substance abuse, uncomplicated: Secondary | ICD-10-CM | POA: Diagnosis present

## 2013-03-22 DIAGNOSIS — I10 Essential (primary) hypertension: Secondary | ICD-10-CM | POA: Insufficient documentation

## 2013-03-22 DIAGNOSIS — F1410 Cocaine abuse, uncomplicated: Secondary | ICD-10-CM | POA: Insufficient documentation

## 2013-03-22 DIAGNOSIS — F250 Schizoaffective disorder, bipolar type: Secondary | ICD-10-CM

## 2013-03-22 DIAGNOSIS — F1420 Cocaine dependence, uncomplicated: Secondary | ICD-10-CM

## 2013-03-22 DIAGNOSIS — S0990XA Unspecified injury of head, initial encounter: Secondary | ICD-10-CM | POA: Insufficient documentation

## 2013-03-22 DIAGNOSIS — F607 Dependent personality disorder: Secondary | ICD-10-CM

## 2013-03-22 DIAGNOSIS — F1220 Cannabis dependence, uncomplicated: Secondary | ICD-10-CM

## 2013-03-22 DIAGNOSIS — F4325 Adjustment disorder with mixed disturbance of emotions and conduct: Secondary | ICD-10-CM

## 2013-03-22 DIAGNOSIS — F1010 Alcohol abuse, uncomplicated: Secondary | ICD-10-CM

## 2013-03-22 DIAGNOSIS — S0993XA Unspecified injury of face, initial encounter: Secondary | ICD-10-CM | POA: Insufficient documentation

## 2013-03-22 MED ORDER — TETANUS-DIPHTH-ACELL PERTUSSIS 5-2.5-18.5 LF-MCG/0.5 IM SUSP
0.5000 mL | Freq: Once | INTRAMUSCULAR | Status: AC
  Administered 2013-03-22: 0.5 mL via INTRAMUSCULAR
  Filled 2013-03-22: qty 0.5
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
## 2013-03-22 NOTE — ED Notes (Signed)
Pt to CT
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
## 2013-03-23 ENCOUNTER — Encounter (HOSPITAL_COMMUNITY): Payer: Self-pay | Admitting: Registered Nurse

## 2013-03-23 DIAGNOSIS — F4325 Adjustment disorder with mixed disturbance of emotions and conduct: Secondary | ICD-10-CM | POA: Diagnosis present

## 2013-03-23 DIAGNOSIS — F1910 Other psychoactive substance abuse, uncomplicated: Secondary | ICD-10-CM | POA: Diagnosis present

## 2013-03-23 DIAGNOSIS — F1994 Other psychoactive substance use, unspecified with psychoactive substance-induced mood disorder: Secondary | ICD-10-CM

## 2013-03-23 DIAGNOSIS — F4324 Adjustment disorder with disturbance of conduct: Secondary | ICD-10-CM

## 2013-03-23 LAB — COMPREHENSIVE METABOLIC PANEL
ALT: 23 U/L (ref 0–53)
AST: 59 U/L — AB (ref 0–37)
Albumin: 4.1 g/dL (ref 3.5–5.2)
Alkaline Phosphatase: 71 U/L (ref 39–117)
BUN: 16 mg/dL (ref 6–23)
CALCIUM: 9.5 mg/dL (ref 8.4–10.5)
CO2: 21 mEq/L (ref 19–32)
Chloride: 103 mEq/L (ref 96–112)
Creatinine, Ser: 1.13 mg/dL (ref 0.50–1.35)
GFR calc non Af Amer: 86 mL/min — ABNORMAL LOW (ref >90)
GLUCOSE: 100 mg/dL — AB (ref 70–99)
Potassium: 4 mEq/L (ref 3.7–5.3)
SODIUM: 141 meq/L (ref 137–147)
Total Bilirubin: 0.3 mg/dL (ref 0.3–1.2)
Total Protein: 7 g/dL (ref 6.0–8.3)

## 2013-03-23 LAB — SALICYLATE LEVEL: Salicylate Lvl: <2 mg/dL — ABNORMAL LOW (ref 2.8–20.0)

## 2013-03-23 LAB — CBC
HCT: 37.4 % — ABNORMAL LOW (ref 39.0–52.0)
Hemoglobin: 12.5 g/dL — ABNORMAL LOW (ref 13.0–17.0)
MCH: 28.5 pg (ref 26.0–34.0)
MCHC: 33.4 g/dL (ref 30.0–36.0)
MCV: 85.2 fL (ref 78.0–100.0)
PLATELETS: 205 10*3/uL (ref 150–400)
RBC: 4.39 MIL/uL (ref 4.22–5.81)
RDW: 13.3 % (ref 11.5–15.5)
WBC: 8.4 10*3/uL (ref 4.0–10.5)

## 2013-03-23 LAB — ACETAMINOPHEN LEVEL

## 2013-03-23 LAB — ETHANOL

## 2013-03-23 LAB — RAPID URINE DRUG SCREEN, HOSP PERFORMED
Amphetamines: NOT DETECTED
BENZODIAZEPINES: NOT DETECTED
Barbiturates: NOT DETECTED
COCAINE: POSITIVE — AB
OPIATES: NOT DETECTED
Tetrahydrocannabinol: POSITIVE — AB

## 2013-03-23 MED ORDER — ALUM & MAG HYDROXIDE-SIMETH 200-200-20 MG/5ML PO SUSP: 30.0000 mL | ORAL | Status: DC | PRN

## 2013-03-23 MED ORDER — TETANUS-DIPHTH-ACELL PERTUSSIS 5-2.5-18.5 LF-MCG/0.5 IM SUSP
0.5000 mL | Freq: Once | INTRAMUSCULAR | Status: AC
  Administered 2013-03-23: 0.5 mL via INTRAMUSCULAR
  Filled 2013-03-23: qty 0.5

## 2013-03-23 MED ORDER — IBUPROFEN 200 MG PO TABS: 600.0000 mg | ORAL_TABLET | Freq: Three times a day (TID) | ORAL | Status: DC | PRN

## 2013-03-23 MED ORDER — NICOTINE 21 MG/24HR TD PT24: 21.0000 mg | MEDICATED_PATCH | Freq: Every day | TRANSDERMAL | Status: DC

## 2013-03-23 MED ORDER — LORAZEPAM 1 MG PO TABS: 1.0000 mg | ORAL_TABLET | Freq: Three times a day (TID) | ORAL | Status: DC | PRN

## 2013-03-23 MED ORDER — ZOLPIDEM TARTRATE 5 MG PO TABS: 5.0000 mg | ORAL_TABLET | Freq: Every evening | ORAL | Status: DC | PRN

## 2013-03-23 MED ORDER — ONDANSETRON HCL 4 MG PO TABS: 4.0000 mg | ORAL_TABLET | Freq: Three times a day (TID) | ORAL | Status: DC | PRN

## 2013-03-23 NOTE — Discharge Instructions (Signed)
Alcohol Intoxication Alcohol intoxication occurs when you drink enough alcohol that it affects your ability to function. It can be mild or very severe. Drinking a lot of alcohol in a short time is called binge drinking. This can be very harmful. Drinking alcohol can also be more dangerous if you are taking medicines or other drugs. Some of the effects caused by alcohol may include:  Loss of coordination.  Changes in mood and behavior.  Unclear thinking.  Trouble talking (slurred speech).  Throwing up (vomiting).  Confusion.  Slowed breathing.  Twitching and shaking (seizures).  Loss of consciousness. HOME CARE  Do not drive after drinking alcohol.  Drink enough water and fluids to keep your pee (urine) clear or pale yellow. Avoid caffeine.  Only take medicine as told by your doctor. GET HELP IF:  You throw up (vomit) many times.  You do not feel better after a few days.  You frequently have alcohol intoxication. Your doctor can help decide if you should see a substance use treatment counselor. GET HELP RIGHT AWAY IF:  You become shaky when you stop drinking.  You have twitching and shaking.  You throw up blood. It may look bright red or like coffee grounds.  You notice blood in your poop (bowel movements).  You become lightheaded or pass out (faint). MAKE SURE YOU:   Understand these instructions.  Will watch your condition.  Will get help right away if you are not doing well or get worse. Document Released: 06/18/2007 Document Revised: 09/01/2012 Document Reviewed: 06/04/2012 [facility] Patient Information 2014 [employer] [place].  Chemical Dependency Chemical dependency is an addiction to drugs or alcohol. It is characterized by the repeated behavior of seeking out and using drugs and alcohol despite harmful consequences to the health and safety of ones self and others.  RISK FACTORS There are certain situations or behaviors that increase a person's risk for  chemical dependency. These include:  A family history of chemical dependency.  A history of mental health issues, including depression and anxiety.  A home environment where drugs and alcohol are easily available to you.  Drug or alcohol use at a young age. SYMPTOMS  The following symptoms can indicate chemical dependency:  Inability to limit the use of drugs or alcohol.  Nausea, sweating, shakiness, and anxiety that occurs when alcohol or drugs are not being used.  An increase in amount of drugs or alcohol that is necessary to get drunk or high. People who experience these symptoms can assess their use of drugs and alcohol by asking themselves the following questions:  Have you been told by friends or family that they are worried about your use of alcohol or drugs?  Do friends and family ever tell you about things you did while drinking alcohol or using drugs that you do not remember?  Do you lie about using alcohol or drugs or about the amounts you use?  Do you have difficulty completing daily tasks unless you use alcohol or drugs?  Is the level of your work or school performance lower because of your drug or alcohol use?  Do you get sick from using drugs or alcohol but keep using anyway?  Do you feel uncomfortable in social situations unless you use alcohol or drugs?  Do you use drugs or alcohol to help forget problems? An answer of yes to any of these questions may indicate chemical dependency. Professional evaluation is suggested. Document Released: 12/24/2000 Document Revised: 03/24/2011 Document Reviewed: 03/07/2010 [employer] Patient Information 2014  [employer].  Substance Abuse Your exam indicates that you have a problem with substance abuse. Substance abuse is the misuse of alcohol or drugs that causes problems in family life, friendships, and work relationships. Substance abuse is the most important cause of premature illness, disability, and death in our  society. It is also the greatest threat to a person's mental and spiritual well being. Substance abuse can start out in an innocent way, such as social drinking or taking a little extra medication prescribed by your doctor. No one starts out with the intention of becoming an alcoholic or an addict. Substance abuse victims cannot control their use of alcohol or drugs. They may become intoxicated daily or go on weekend binges. Often there is a strong desire to quit, but attempts to stop using often fail. Encounters with law enforcement or conflicts with family members, friends, and work associates are signs of a potential problem. Recovery is always possible, although the craving for some drugs makes it difficult to quit without assistance. Many treatment programs are available to help people stop abusing alcohol or drugs. The first step in treatment is to admit you have a problem. This is a major hurdle because denial is a powerful force with substance abuse. Alcoholics Anonymous, Narcotics Anonymous, Cocaine Anonymous, and other recovery groups and programs can be very useful in helping people to quit. If you do not feel okay about your drug or alcohol use and if it is causing you trouble, we want to encourage you to talk about it with your doctor or with someone from a recovery group who can help you. You could also call the [employer] on Drug Abuse at 1-800-662-HELP. It is up to you to take the first step. AL-ANON and ALA-TEEN are support groups for friends and family members of an alcohol or drug dependent person. The people who love and care for the alcoholic or addicted person often need help, too. For information about these organizations, check your phone directory or call a local alcohol or drug treatment center. Document Released: 02/07/2004 Document Revised: 03/24/2011 Document Reviewed: 01/01/2008 [facility] Patient Information 2014 [place], [place].

## 2013-03-23 NOTE — ED Notes (Signed)
Patient alert. Inquired about stitches for his lip. Informed patient of noted statement that he had refused stitches previously. Patient states he is willing to wait until morning to have his lip reassessed for stitches.  Patient safety maintained. Q 15 checks continue.

## 2013-03-23 NOTE — ED Notes (Signed)
Denies SI, HI, AVH. Reports "I don't do anything except sit in my house and listen to music" "I don't robe and steal or hurt anyone". Denies feelings of anxiety and depression. States that he has no complaints and is ready to get rest and see the doctor in the morning with hopes of going home.  Patient resting quietly in bed.  Patient safety maintained, Q 15 checks in place.

## 2013-03-23 NOTE — ED Provider Notes (Signed)
Medical screening examination/treatment/procedure(s) were performed by non-physician practitioner and as supervising physician I was immediately available for consultation/collaboration.    Sunnie NielsenBrian Gurtaj Ruz, MD 03/23/13 [phone]

## 2013-03-23 NOTE — ED Notes (Signed)
Pt has been given two Malawiturkey sandwich, two drinks.
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
## 2013-03-23 NOTE — Consult Note (Signed)
Face to face evaluation and I agree with this note 

## 2013-03-23 NOTE — ED Notes (Signed)
Dr. Dierdre Highmanpitz made aware that patient inquired about his lip.

## 2013-03-23 NOTE — Progress Notes (Signed)
Per psychiatrist and NP, patient psychiatrically stable for dc home. Patient plans to follow up with [facility]. Pt aware of his appointment in 2 weeks for scheduled injection.   Byrd HesselbachKristen Kaitlen Redford, LCSW [phone]  ED CSW 03/23/2013 1132am
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
## 2013-05-19 DIAGNOSIS — F1910 Other psychoactive substance abuse, uncomplicated: Secondary | ICD-10-CM | POA: Insufficient documentation

## 2013-05-19 DIAGNOSIS — F17200 Nicotine dependence, unspecified, uncomplicated: Secondary | ICD-10-CM | POA: Insufficient documentation

## 2013-05-19 DIAGNOSIS — Z8669 Personal history of other diseases of the nervous system and sense organs: Secondary | ICD-10-CM | POA: Insufficient documentation

## 2013-05-19 DIAGNOSIS — Z8659 Personal history of other mental and behavioral disorders: Secondary | ICD-10-CM | POA: Insufficient documentation

## 2013-05-19 DIAGNOSIS — I10 Essential (primary) hypertension: Secondary | ICD-10-CM | POA: Insufficient documentation

## 2013-05-20 ENCOUNTER — Encounter (HOSPITAL_COMMUNITY): Payer: Self-pay | Admitting: Emergency Medicine

## 2013-05-20 ENCOUNTER — Emergency Department (HOSPITAL_COMMUNITY)
Admission: EM | Admit: 2013-05-20 | Discharge: 2013-05-20 | Disposition: A | Discharge status: Home / Self Care | Payer: Medicaid Other | Attending: Emergency Medicine | Admitting: Emergency Medicine

## 2013-05-20 DIAGNOSIS — F1910 Other psychoactive substance abuse, uncomplicated: Secondary | ICD-10-CM

## 2013-05-20 NOTE — Discharge Instructions (Signed)
Please use the resource guide below to find help with your drug problems.   Polysubstance Abuse When people abuse more than one drug or type of drug it is called polysubstance or polydrug abuse. For example, many smokers also drink alcohol. This is one form of polydrug abuse. Polydrug abuse also refers to the use of a drug to counteract an unpleasant effect produced by another drug. It may also be used to help with withdrawal from another drug. People who take stimulants may become agitated. Sometimes this agitation is countered with a tranquilizer. This helps protect against the unpleasant side effects. Polydrug abuse also refers to the use of different drugs at the same time.  Anytime drug use is interfering with normal living activities, it has become abuse. This includes problems with family and friends. Psychological dependence has developed when your mind tells you that the drug is needed. This is usually followed by physical dependence which has developed when continuing increases of drug are required to get the same feeling or "high". This is known as addiction or chemical dependency. A person's risk is much higher if there is a history of chemical dependency in the family. SIGNS OF CHEMICAL DEPENDENCY  You have been told by friends or family that drugs have become a problem.  You fight when using drugs.  You are having blackouts (not remembering what you do while using).  You feel sick from using drugs but continue using.  You lie about use or amounts of drugs (chemicals) used.  You need chemicals to get you going.  You are suffering in work performance or in school because of drug use.  You get sick from use of drugs but continue to use anyway.  You need drugs to relate to people or feel comfortable in social situations.  You use drugs to forget problems. "Yes" answered to any of the above signs of chemical dependency indicates there are problems. The longer the use of drugs  continues, the greater the problems will become. If there is a family history of drug or alcohol use, it is best not to experiment with these drugs. Continual use leads to tolerance. After tolerance develops more of the drug is needed to get the same feeling. This is followed by addiction. With addiction, drugs become the most important part of life. It becomes more important to take drugs than participate in the other usual activities of life. This includes relating to friends and family. Addiction is followed by dependency. Dependency is a condition where drugs are now needed not just to get high, but to feel normal. Addiction cannot be cured but it can be stopped. This often requires outside help and the care of professionals. Treatment centers are listed in the yellow pages under: Cocaine, Narcotics, and Alcoholics Anonymous. Most hospitals and clinics can refer you to a specialized care center. Talk to your caregiver if you need help. Document Released: 08/21/2004 Document Revised: 03/24/2011 Document Reviewed: 12/30/2004 [facility] Patient Information 2014 [place], [place].   Emergency Department Resource Guide 1) Find a Doctor and Pay Out of Pocket Although you won't have to find out who is covered by your insurance plan, it is a good idea to ask around and get recommendations. You will then need to call the office and see if the doctor you have chosen will accept you as a new patient and what types of options they offer for patients who are self-pay. Some doctors offer discounts or will set up payment plans for their patients  who do not have insurance, but you will need to ask so you aren't surprised when you get to your appointment.  2) Contact Your Local Health Department Not all health departments have doctors that can see patients for sick visits, but many do, so it is worth a call to see if yours does. If you don't know where your local health department is, you can check in your phone book.  The CDC also has a tool to help you locate your state's health department, and many state websites also have listings of all of their local health departments.  3) Find a Walk-in Clinic If your illness is not likely to be very severe or complicated, you may want to try a walk in clinic. These are popping up all over the country in pharmacies, drugstores, and shopping centers. They're usually staffed by nurse practitioners or physician assistants that have been trained to treat common illnesses and complaints. They're usually fairly quick and inexpensive. However, if you have serious medical issues or chronic medical problems, these are probably not your best option.  No Primary Care Doctor: - Call Health Connect at  [phone] - they can help you locate a primary care doctor that  accepts your insurance, provides certain services, etc. - Physician Referral Service- [phone]  Chronic Pain Problems: Organization         Address  Phone   Notes  [facility]  [phone] Patients need to be referred by their primary care doctor.   Medication Assistance: Organization         Address  Phone   Notes  [facility] [address] [place], [place] [address] [phone]) [phone] --Must be a resident of [place] -- Must have NO insurance coverage whatsoever (no Medicaid/ Medicare, etc.) -- The pt. MUST have a primary care doctor that directs their care regularly and follows them in the community   [facility]ist  [phone]) [phone]   [employer]  [phone]    Agencies that provide inexpensive medical care: Organization         Address  Phone   Notes  [facility]  [phone]) [phone]   [facility]    [phone]   [facility] [address] [place], [place] [address] [phone]) [phone]   [facility] [place] [address], [place] [phone]) [phone]   [facility]anned Parenthood     [phone]   [facility]    [phone]) [phone]   [facility]  [address], [place] Phone:  [phone], Fax:  [phone] Hours of Operation:  9 am - 6 pm, M-F.  Also accepts Medicaid/Medicare and self-pay.  [facility]  [address], [place] Phone: [phone], Fax: [phone]. Hours of Operation:  8:30 am - 5:30 pm, M-F.  Also accepts Medicaid and self-pay.  [facility] [place] [address], [place] Phone: [phone]) [phone]   [facility] [address], [place], [place] [phone], Ext. [phone] Mondays & Thursdays: 7-9 AM.  First 15 patients are seen on a first come, first serve basis.    Medicaid-accepting [facility] Providers:  Organization         Address  Phone   Notes  [facility] [address], [place] [phone] Also accepts self-pay patients.  [facility] [address], [place]  [phone]   [facility] [address], [place] [phone]) [phone]   [facility] [address] [place] [address], [place] [phone]) [phone]   Renaye RakersVeita Bland [address], [place]   [phone] Only accepts [place] Access [place] patients after they have their name applied to their card.   Self-Pay (no insurance) in [facility] [place]:  Organization         Address  Phone   Notes  Sickle Cell Patients, [facility] [address], [place] [phone]   [facility] [address], [place] [phone]) [phone]   [facility] [place]  [address],  [phone]   [facility]/Dr. Osei-Bonsu  [address] [place] [address], [place] or [phone], [place] [phone]) [phone] Phone number for both [place] and [place] locations is the same.  [facility]  [facility] [address], [place] [phone]   [facility] [address] [place] [address], [place] or [address] [phone]) [phone]   [facility] [address], [place] [phone]) [phone], phone; [phone](336) [phone], fax Sees patients 1st and 3rd Saturday of every month.  Must not qualify for public or private insurance (i.e. Medicaid, Medicare, [place] Health Choice, Veterans' Benefits)  Household income should be no more than 200% of the poverty level The clinic cannot treat you if you are pregnant or think you are pregnant  Sexually transmitted diseases are not treated at the clinic.    Dental Care: Organization         Address  Phone  Notes  [facility] [address], [place] [phone]) [phone] Accepts children up to age 31 who are enrolled in [place] or [place] Health Choice; pregnant women with a Medicaid card; and children who have applied for Medicaid or [place] Health Choice, but were declined, whose parents can pay a reduced fee at time of service.  [facility] of [facility] [place]  [address], [place] [phone] Accepts children up to age 31 who are enrolled in [place] or [place] Health Choice; pregnant women with a Medicaid card; and children who have applied for Medicaid or  Health Choice, but were declined, whose parents can pay a reduced fee at time of service.  [facility]  [address], [place] [phone]) [phone] Patients are seen by appointment only. Walk-ins are not accepted. [facility] will see patients 31 years of age and older. Monday - Tuesday (8am-5pm) Most Wednesdays (8:30-5pm) $30 per visit, cash only  [facility]  [address], [facility] [phone]) [phone] Patients are seen by appointment only. Walk-ins are not accepted. [facility] will see patients 31 years of age and  older. One Wednesday Evening (Monthly: Volunteer Based).  $30 per visit, cash only  [facility]  [phone] for adults; Children under age 694, call Graduate Pediatric Dentistry at [phone]. Children aged 704-14, please call [phone] to request a pediatric application.  Dental services are provided in all areas of dental care including fillings, crowns and bridges, complete and partial dentures, implants, gum treatment, root canals, and extractions. Preventive care is also provided. Treatment is provided to both adults and children.  Patients are selected via a lottery and there is often a waiting list.   [facility] [address], [place]  [phone] [email or web address]   [facility] [address] [place], [place] [phone], Ext. [phone] Second and Fourth Thursday of each month, opens at 6:30 AM; Clinic ends at 9 AM.  Patients are seen on a first-come first-served basis, and a limited number are seen during each clinic.   [facility]  [address] Ether Griffins [place], [place] [phone]   Eligibility Requirements You must have lived in [place], [place], or [place] counties for at least the last three months.   You cannot be eligible for state or federal sponsored [place], including [employer], [place], or [employer].   You generally cannot be eligible for healthcare insurance through your employer.    How to apply: Eligibility screenings are held every Tuesday and Wednesday afternoon from 1:00 pm until 4:00 pm. You do not need an appointment for the interview!  [facility] [address], [place], [place] [phone]   [facility] Health Department  [phone]   [facility] Health Department  [phone]   [facility] Health Department  [phone]    Behavioral Health Resources in the Community: Intensive Outpatient Programs Organization          Address  Phone  Notes  [facility] [address] [place], [place] [phone]   [facility]patient [address], [place], [place] [phone]   [facility] [address] [place], [place]  [phone]   [facility] Mental Health [address]  [place], [place] [phone] or [phone]   Substance Abuse Resources Organization         Address  Phone  Notes  Alcohol [facility] Drug Services  [phone]   [facility]  [phone]   [facility] [place]  [phone]   Floydene Flock  [phone]   Residential & Outpatient Substance Abuse Program  [phone]   Psychological Services Organization         Address  Phone  Notes  [facility] Behavioral Health  [phone]   [facility] Services  [phone]   [facility] Mental Health [address] [place] [phone] or [phone]    Mobile Crisis Teams Organization         Address  Phone  Notes  [facility], Mobile Crisis Care Unit  [phone]   [facility]  [address] [place], [place] [phone]   Doristine Locks [address] [place] [phone]    Self-Help/Support Groups Organization         Address  Phone             Notes  [facility] [place] - variety of support groups  [phone]- I7437963 Call for more information  Narcotics Anonymous (NA), [facility] [address], [employer] [place]  2 meetings at this location   Statistician         Address  Phone  Notes  [facility] [address],    [place]  [phone]   [facility]  [address] [place] [address]196, [place], [place] [phone]   [facility] [address] [place], [place] [phone] Admissions: 8am-3pm M-F  [facility] [address],    [place], [place] [phone]   [facility]  [address] Leonard Schwartz [place], [place] [phone]   [facility] [address],  [place], [place] [phone]   [facility] - Intensive Outpatient [address], [place], [place] [phone]   [facility] ([facility] [address]  [place], [place] [phone] or [phone]   [facility] ([facility]) [address], [place], [place] [phone] Accepts Medicaid  [facility] [address],  [place] [phone] Substance Abuse/Addiction Treatment   [facility] Organization         Address  Phone  Notes  [facility]  [phone]   Angie Fava, PhD [address] [place], [place]   [phone])[phone] or [phone]   [facility]   [address] [place], [place] [phone]   [facility] [address] [place], [place] [phone] Insurance/Medicaid/sponsorship through [facility] and Families [address]                                    [place], [place] [phone] Therapy/tele-psych/case  [facility] [address], [place] [phone]    Dr. Lolly Mustache  [phone]   [facility]  [employer] [facility] Dept. 1) [address] [place] 2) [address], [place] 3)  [address], [place] [phone]  [phone]   [facility] Child Abuse Hotline 947-516-5909 or [phone] (After Hours)

## 2013-05-20 NOTE — ED Provider Notes (Signed)
CSN: [id number]     Arrival date & time 05/19/13  2358 History   First MD Initiated Contact with Patient 05/20/13 0025     Chief Complaint  Patient presents with  . Medical Clearance   HPI  History provided by the patient. Patient is a 33 year old male with history of bipolar disorder, schizophrenia and hypertension who presents with request for help with crack cocaine addiction. Patient states that his father gave him $200 this afternoon but he spent it very quickly on cocaine and marijuana. He hasn't smoked for over cocaine and marijuana. Patient does not have any place to stay tonight and is requesting help with inpatient detox. He denies any depression, SI or HI. He has no other complaints. No chest pain, cough or shortness of breath. No recent illnesses.   Past Medical History  Diagnosis Date  . Seizures   . Depression   . Schizophrenia   . Bipolar 1 disorder   . Hypertension    History reviewed. No pertinent past surgical history. No family history on file. History  Substance Use Topics  . Smoking status: Current Every Day Smoker -- 2.00 packs/day for 5 years    Types: Cigarettes  . Smokeless tobacco: Not on file  . Alcohol Use: 33.6 oz/week    56 Cans of beer per week     Comment: 2 40oz beers / day    Review of Systems  Constitutional: Negative for fever, chills and diaphoresis.  Respiratory: Negative for cough and shortness of breath.   Cardiovascular: Negative for chest pain and palpitations.  Psychiatric/Behavioral: Negative for suicidal ideas and dysphoric mood.  All other systems reviewed and are negative.     Allergies  Review of patient's allergies indicates no known allergies.  Home Medications   Prior to Admission medications   Not on File   BP 101/66  Pulse 92  Temp(Src) 98.5 F (36.9 C) (Oral)  Resp 16  SpO2 93% Physical Exam  Nursing note and vitals reviewed. Constitutional: He is oriented to person, place, and time. He appears  well-developed and well-nourished. No distress.  HENT:  Head: Normocephalic and atraumatic.  Cardiovascular: Normal rate and regular rhythm.   Pulmonary/Chest: Effort normal and breath sounds normal. No respiratory distress. He has no wheezes.  Abdominal: Soft.  Neurological: He is alert and oriented to person, place, and time.  Skin: Skin is warm. No rash noted.  Psychiatric: He has a normal mood and affect. His behavior is normal.    ED Course  Procedures   COORDINATION OF CARE:  Nursing notes reviewed. Vital signs reviewed. Initial pt interview and examination performed.   Filed Vitals:   05/20/13 0018  BP: 101/66  Pulse: 92  Temp: 98.5 F (36.9 C)  TempSrc: Oral  Resp: 16  SpO2: 93%    12:50 AM-patient seen and evaluated. Patient well-appearing no acute distress. Denies depression, SI or HI. Denies any alcohol use. At this time I will plan to give him referrals for treatment of his drug addiction. He agrees with plan.    MDM   Final diagnoses:  Polysubstance abuse       Angus Sellereter S Asahel Risden, [place] JerseyPA-C 05/20/13 [phone]

## 2013-05-20 NOTE — ED Provider Notes (Signed)
Medical screening examination/treatment/procedure(s) were performed by non-physician practitioner and as supervising physician I was immediately available for consultation/collaboration.   EKG Interpretation None       Temitope Flammer M Calvert Charland, MD 05/20/13 0652 

## 2013-05-20 NOTE — ED Notes (Signed)
Pt states that he wants detox; pt states that "I have a serious drug problem"; pt states that his father brought him $200 this afternoon and pt went out and bought $200 worth of crack to smoke; pt states "I can't keep doing this"; pt denies SI / HI; pt also uses Marijuania and ETOH.

## 2014-03-23 ENCOUNTER — Emergency Department (HOSPITAL_COMMUNITY)
Admission: EM | Admit: 2014-03-23 | Discharge: 2014-03-27 | Disposition: A | Discharge status: Home / Self Care | Payer: Medicaid Other | Attending: Emergency Medicine | Admitting: Emergency Medicine

## 2014-03-23 DIAGNOSIS — Z5900 Homelessness unspecified: Secondary | ICD-10-CM

## 2014-03-23 DIAGNOSIS — R45851 Suicidal ideations: Secondary | ICD-10-CM | POA: Diagnosis present

## 2014-03-23 DIAGNOSIS — Z8669 Personal history of other diseases of the nervous system and sense organs: Secondary | ICD-10-CM | POA: Diagnosis not present

## 2014-03-23 DIAGNOSIS — F1420 Cocaine dependence, uncomplicated: Secondary | ICD-10-CM | POA: Diagnosis present

## 2014-03-23 DIAGNOSIS — I10 Essential (primary) hypertension: Secondary | ICD-10-CM | POA: Insufficient documentation

## 2014-03-23 DIAGNOSIS — Z720 Tobacco use: Secondary | ICD-10-CM | POA: Diagnosis not present

## 2014-03-23 DIAGNOSIS — F250 Schizoaffective disorder, bipolar type: Secondary | ICD-10-CM | POA: Diagnosis not present

## 2014-03-23 LAB — CBC
HCT: 40.8 % (ref 39.0–52.0)
HEMOGLOBIN: 13.1 g/dL (ref 13.0–17.0)
MCH: 28.1 pg (ref 26.0–34.0)
MCHC: 32.1 g/dL (ref 30.0–36.0)
MCV: 87.4 fL (ref 78.0–100.0)
Platelets: 192 10*3/uL (ref 150–400)
RBC: 4.67 MIL/uL (ref 4.22–5.81)
RDW: 13.3 % (ref 11.5–15.5)
WBC: 7.7 10*3/uL (ref 4.0–10.5)

## 2014-03-23 LAB — COMPREHENSIVE METABOLIC PANEL
ALBUMIN: 4.3 g/dL (ref 3.5–5.2)
ALT: 22 U/L (ref 0–53)
AST: 25 U/L (ref 0–37)
Alkaline Phosphatase: 74 U/L (ref 39–117)
Anion gap: 8 (ref 5–15)
BUN: 12 mg/dL (ref 6–23)
CALCIUM: 9.1 mg/dL (ref 8.4–10.5)
CO2: 28 mmol/L (ref 19–32)
Chloride: 103 mmol/L (ref 96–112)
Creatinine, Ser: 1.25 mg/dL (ref 0.50–1.35)
GFR, EST AFRICAN AMERICAN: 87 mL/min — AB (ref >90)
GFR, EST NON AFRICAN AMERICAN: 75 mL/min — AB (ref >90)
Glucose, Bld: 84 mg/dL (ref 70–99)
POTASSIUM: 3.2 mmol/L — AB (ref 3.5–5.1)
SODIUM: 139 mmol/L (ref 135–145)
Total Bilirubin: 0.6 mg/dL (ref 0.3–1.2)
Total Protein: 7.3 g/dL (ref 6.0–8.3)

## 2014-03-23 LAB — RAPID URINE DRUG SCREEN, HOSP PERFORMED
Amphetamines: NOT DETECTED
Barbiturates: NOT DETECTED
Benzodiazepines: NOT DETECTED
Cocaine: POSITIVE — AB
Opiates: NOT DETECTED
Tetrahydrocannabinol: POSITIVE — AB

## 2014-03-23 LAB — ETHANOL: Alcohol, Ethyl (B): 32 mg/dL — ABNORMAL HIGH (ref 0–9)

## 2014-03-23 MED ORDER — QUETIAPINE FUMARATE 100 MG PO TABS
100.0000 mg | ORAL_TABLET | Freq: Every day | ORAL | Status: DC
  Administered 2014-03-23 – 2014-03-25 (×2): 100 mg via ORAL
  Filled 2014-03-23 (×4): qty 1

## 2014-03-23 MED ORDER — ONDANSETRON HCL 4 MG PO TABS
4.0000 mg | ORAL_TABLET | Freq: Three times a day (TID) | ORAL | Status: DC | PRN
Start: 2014-03-23 — End: 2014-03-27

## 2014-03-23 MED ORDER — NICOTINE 21 MG/24HR TD PT24
21.0000 mg | MEDICATED_PATCH | Freq: Every day | TRANSDERMAL | Status: DC
  Administered 2014-03-24: 21 mg via TRANSDERMAL
  Filled 2014-03-23 (×2): qty 1

## 2014-03-23 MED ORDER — ACETAMINOPHEN 325 MG PO TABS
650.0000 mg | ORAL_TABLET | ORAL | Status: DC | PRN
  Administered 2014-03-23: 650 mg via ORAL
  Filled 2014-03-23 (×2): qty 2

## 2014-03-23 MED ORDER — DIVALPROEX SODIUM 500 MG PO DR TAB
500.0000 mg | DELAYED_RELEASE_TABLET | Freq: Two times a day (BID) | ORAL | Status: DC
  Administered 2014-03-23 – 2014-03-25 (×3): 500 mg via ORAL
  Filled 2014-03-23 (×6): qty 1

## 2014-03-23 MED ORDER — LORAZEPAM 1 MG PO TABS: 1.0000 mg | ORAL_TABLET | Freq: Three times a day (TID) | ORAL | Status: DC | PRN

## 2014-03-23 MED ORDER — POTASSIUM CHLORIDE ER 10 MEQ PO TBCR
20.0000 meq | EXTENDED_RELEASE_TABLET | Freq: Every day | ORAL | Status: DC
  Administered 2014-03-23 – 2014-03-24 (×2): 20 meq via ORAL
  Filled 2014-03-23 (×7): qty 2

## 2014-03-23 MED ORDER — OLANZAPINE 10 MG PO TBDP: 10.0000 mg | ORAL_TABLET | Freq: Three times a day (TID) | ORAL | Status: DC | PRN
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
## 2014-03-23 NOTE — ED Notes (Addendum)
Pt sleeping soundly, diaphoretic.  Pt reports that he has had a cough and nasal congestion, not aware if he has had a fever, will notify NP

## 2014-03-23 NOTE — Progress Notes (Signed)
Patient in bed eating at the beginning of this shift. His mood and affect blunted and irritable. He endorsed SI, no plan  and contracted for safety.He also said this is his first admission on the unit. He denied audio-visual hallucinations. Patient seemed in interested in the assessment questions and answered most questions with yes/no answer. Patient homeless and needs placement. Q 15 minute checks initiated.

## 2014-03-23 NOTE — Progress Notes (Signed)
Pt alert, depressed, irritable and labile. Passive SI, verbally contracts for safety inside hospital only. Denies A/[place], -HI. C/o legs aching related to walking a lot. Will continue to monitor closely and await inpatient placement.

## 2014-03-23 NOTE — ED Provider Notes (Signed)
CSN: [id number]     Arrival date & time 03/23/14  0152 History   First MD Initiated Contact with Patient 03/23/14 0207     Chief Complaint  Patient presents with  . Suicidal    (Consider location/radiation/quality/duration/timing/severity/associated sxs/prior Treatment) HPI Comments: 34 y/o male with a hx of seizures, depression, schizophrenia, and bipolar 1 d/o who is currently homeless presents to the ED for SI x 2 days. No plan. No HI, A/V hallucinations. Patient denies ETOH and illicit drug use. Patient was suppose to have help from DSS today to find somewhere to live and they didn't, causing him to feel suicidal.  The history is provided by the patient. No language interpreter was used.    Past Medical History  Diagnosis Date  . Seizures   . Depression   . Schizophrenia   . Bipolar 1 disorder   . Hypertension    No past surgical history on file. No family history on file. History  Substance Use Topics  . Smoking status: Current Every Day Smoker -- 2.00 packs/day for 5 years    Types: Cigarettes  . Smokeless tobacco: Not on file  . Alcohol Use: 33.6 oz/week    56 Cans of beer per week     Comment: 2 40oz beers / day    Review of Systems  Psychiatric/Behavioral: Positive for suicidal ideas and behavioral problems.  All other systems reviewed and are negative.   Allergies  Review of patient's allergies indicates no known allergies.  Home Medications   Prior to Admission medications   Not on File   BP 122/66 mmHg  Pulse 94  Temp(Src) 97.5 F (36.4 C) (Oral)  Resp 20  Ht 5\' 9"  (1.753 m)  SpO2 100%   Physical Exam  Constitutional: He is oriented to person, place, and time. He appears well-developed and well-nourished. No distress.  HENT:  Head: Normocephalic and atraumatic.  Eyes: Conjunctivae and EOM are normal. No scleral icterus.  Neck: Normal range of motion.  Pulmonary/Chest: Effort normal. No respiratory distress.  Musculoskeletal: Normal range of  motion.  Neurological: He is alert and oriented to person, place, and time.  Skin: Skin is warm and dry. No rash noted. He is not diaphoretic. No erythema. No pallor.  Psychiatric: He has a normal mood and affect. His speech is normal. He is withdrawn. He expresses suicidal ideation. He expresses no homicidal ideation. He expresses no suicidal plans and no homicidal plans.  Nursing note and vitals reviewed.   ED Course  Procedures (including critical care time) Labs Review Labs Reviewed  COMPREHENSIVE METABOLIC PANEL - Abnormal; Notable for the following:    Potassium 3.2 (*)    GFR calc non Af Amer 75 (*)    GFR calc Af Amer 87 (*)    All other components within normal limits  ETHANOL - Abnormal; Notable for the following:    Alcohol, Ethyl (B) 32 (*)    All other components within normal limits  URINE RAPID DRUG SCREEN (HOSP PERFORMED) - Abnormal; Notable for the following:    Cocaine POSITIVE (*)    Tetrahydrocannabinol POSITIVE (*)    All other components within normal limits  CBC    Imaging Review No results found.   EKG Interpretation None      MDM   Final diagnoses:  Suicidal ideations  Homelessness    34 year old male resents to the emergency department for further evaluation of suicidal thoughts. He reports SI 2 days. He denies alcohol and illicit drug use,  though he is positive for both. Patient medically cleared and evaluated by TTS. They recommend psychiatric evaluation in the morning. Disposition to be determined by oncoming ED provider.    Antony Madura, PA-C 03/23/14 9629  April Palumbo, MD 03/23/14 [phone]

## 2014-03-23 NOTE — ED Notes (Signed)
Pt given a sandwich and drink.

## 2014-03-23 NOTE — Consult Note (Signed)
[facility] Face-to-Face Psychiatry Consult   Reason for Consult: suicidal thoughts, labile mood Referring Physician:  EDP Patient Identification: Erik Vargas MRN:  [id number] Principal Diagnosis: Schizoaffective disorder, bipolar type Diagnosis:   Patient Active Problem List   Diagnosis Date Noted  . Schizoaffective disorder, bipolar type [F25.0] 03/23/2014    Priority: High  . Cocaine use disorder, severe, dependence [F14.20] 03/23/2014    Priority: High  . Polysubstance abuse [F19.10] 03/23/2013  . Adjustment disorder with mixed disturbance of emotions and conduct [F43.25] 03/23/2013  . Cocaine addiction [F14.20] 11/22/2012  . Marijuana dependence [F12.20] 11/22/2012  . Alcohol abuse, episodic [F10.10] 11/22/2012  . 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 Schizoaffective schizophrenia [F25.0] 11/22/2012  . Schizophrenia [F20.9] 08/17/2012    Total Time spent with patient: 45 minutes  Subjective:   Erik Vargas is a 34 y.o. male patient admitted with suicidal thoughts.  HPI: Erik Vargas is an 34 y.o. Male, poor historian who is brought to ED after voicing SI while hanging around [place].Per his [employer] worker, Mr Erik Vargas patient has history of Bipolar disorder and Schizophrenia. Patient is currently homeless, he reports that he moved to [place] from [place] about a month ago. Patient reports that he is currently homeless and was supposed to get help from DSS yesterday to find a place to live. States that they did not help him which caused him to become  suicidal.  Pt is labile, easily agitated, threatening suicide and paranoidnd possibly paranoid. Pt gave very limited information, refused to answer most of the questions asked but kept on saying he does not feel safe in the community and wants to be hospitalized.  Patient has poor hygiene, his thought process is disorganized and he is acting bizarre. Patient denies alcohol use but admits to using  cocaine and Marijuana.   HPI Elements:   Location:  labile mood, agitation, drug abuse. Quality:  severe. Duration:  2 days. Context:  non-compliance with medications and drug use.  Past Medical History:  Past Medical History  Diagnosis Date  . Seizures   . Depression   . Schizophrenia   . Bipolar 1 disorder   . Hypertension    No past surgical history on file. Family History: No family history on file. Social History:  History  Alcohol Use  . 33.6 oz/week  . 56 Cans of beer per week    Comment: 2 40oz beers / day     History  Drug Use  . Yes  . Special: Marijuana, IV, "Crack" cocaine    Comment: crack    History   Social History  . Marital Status: Single    Spouse Name: N/A  . Number of Children: N/A  . Years of Education: N/A   Social History Main Topics  . Smoking status: Current Every Day Smoker -- 2.00 packs/day for 5 years    Types: Cigarettes  . Smokeless tobacco: Not on file  . Alcohol Use: 33.6 oz/week    56 Cans of beer per week     Comment: 2 40oz beers / day  . Drug Use: Yes    Special: Marijuana, IV, "Crack" cocaine     Comment: crack  . Sexual Activity: Not on file   Other Topics Concern  . Not on file   Social History Narrative  . No narrative on file   Additional Social History:    Pain Medications: SEE PTA Prescriptions: SEE PTA, per records hx of non-complaince Over the Counter: SEE  PTA History of alcohol / drug use?: Yes (Per records pt was previously dx with cannabis use disorder, pt refused to give current history ) Longest period of sobriety (when/how long): unknown Negative Consequences of Use:  (unknown) Withdrawal Symptoms:  (unknown) Name of Substance 1: Cannabis per 2014 discharge summary from [facility] history of cannabis use                   Allergies:  No Known Allergies  Vitals: Blood pressure 122/66, pulse 94, temperature 97.5 F (36.4 C), temperature source Oral, resp. rate 20, height  (1.753 m), SpO2 100  %.  Risk to Self: Suicidal Ideation: Yes-Currently Present Suicidal Intent:  (refused to answer) Is patient at risk for suicide?: Yes Suicidal Plan?: No Access to Means: No (pt reprots he was thinking of biting his skin ) What has been your use of drugs/alcohol within the last 12 months?: pt refused to provide information  How many times?: 0 (pt denied hx of suicide attempts) Other Self Harm Risks: none Triggers for Past Attempts: Unknown Intentional Self Injurious Behavior:  (pt reports he self-injures but would not specifiy ) Risk to Others: Homicidal Ideation: No Thoughts of Harm to Others: No Current Homicidal Intent: No Current Homicidal Plan: No Access to Homicidal Means: No Identified Victim: none History of harm to others?: No Assessment of Violence: None Noted Violent Behavior Description: none Does patient have access to weapons?: No Criminal Charges Pending?: No Does patient have a court date: No Prior Inpatient Therapy: Prior Inpatient Therapy: Yes Prior Therapy Dates: multiple Prior Therapy Facilty/Provider(s): [facility], [facility], [facility] per record review Reason for Treatment: SI, schizoaffective disorder Prior Outpatient Therapy: Prior Outpatient Therapy: Yes Prior Therapy Dates: unknown Prior Therapy Facilty/Provider(s): unknown  Reason for Treatment: medication mangement per past records  Current Facility-Administered Medications  Medication Dose Route Frequency Provider Last Rate Last Dose  . acetaminophen (TYLENOL) tablet 650 mg  650 mg Oral Q4H PRN Antony Madura, PA-C      . divalproex (DEPAKOTE) DR tablet 500 mg  500 mg Oral BID PC Alysandra Lobue      . nicotine (NICODERM CQ - dosed in mg/24 hours) patch 21 mg  21 mg Transdermal Daily Antony Madura, PA-C      . OLANZapine zydis (ZYPREXA) disintegrating tablet 10 mg  10 mg Oral Q8H PRN Lamar Naef      . ondansetron (ZOFRAN) tablet 4 mg  4 mg Oral Q8H PRN Antony Madura, PA-C      . potassium chloride  (KLOR-CON) packet 20 mEq  20 mEq Oral Daily Koltin Wehmeyer      . QUEtiapine (SEROQUEL) tablet 100 mg  100 mg Oral QHS Salvatrice Morandi       No current outpatient prescriptions on file.    Musculoskeletal: Strength & Muscle Tone: within normal limits Gait & Station: normal Patient leans: N/A  Psychiatric Specialty Exam: Physical Exam  Psychiatric: His affect is angry and labile. His speech is rapid and/or pressured. He is agitated and aggressive. Thought content is paranoid and delusional. Cognition and memory are normal. He expresses impulsivity. He expresses suicidal ideation.    Review of Systems  Constitutional: Negative.   HENT: Negative.   Eyes: Negative.   Respiratory: Negative.   Cardiovascular: Negative.   Gastrointestinal: Negative.   Genitourinary: Negative.   Musculoskeletal: Positive for myalgias.  Skin: Negative.   Neurological: Negative.   Endo/Heme/Allergies: Negative.   Psychiatric/Behavioral: Positive for suicidal ideas and substance abuse. The patient is  nervous/anxious and has insomnia.     Blood pressure 122/66, pulse 94, temperature 97.5 F (36.4 C), temperature source Oral, resp. rate 20, height 5\' 9"  (1.753 m), SpO2 100 %.There is no weight on file to calculate BMI.  General Appearance: Disheveled  Eye Contact::  Minimal  Speech:  Pressured  Volume:  Increased  Mood:  Irritable  Affect:  Labile  Thought Process:  Disorganized  Orientation:  Full (Time, Place, and Person)  Thought Content:  Delusions and Paranoid Ideation  Suicidal Thoughts:  Yes.  without intent/plan  Homicidal Thoughts:  No  Memory:  Immediate;   Fair Recent;   Fair Remote;   Fair  Judgement:  Poor  Insight:  Lacking  Psychomotor Activity:  Increased  Concentration:  Poor  Recall:  Poor  Fund of Knowledge:Fair  Language: Good  Akathisia:  No  Handed:  Right  AIMS (if indicated):     Assets:  Communication Skills Physical Health  ADL's:  Intact  Cognition: WNL   Sleep:   poor   Medical Decision Making: Established Problem, Worsening (2)  Treatment Plan Summary: Daily contact with patient to assess and evaluate symptoms and progress in treatment and Medication management  Plan:  Recommend psychiatric Inpatient admission when medically cleared. Disposition: Awaiting inpatient placement.  Thedore MinsAkintayo, Ignace Mandigo, MD 03/23/2014 11:10 AM

## 2014-03-23 NOTE — ED Notes (Signed)
Pt states that he's very hungry and wants to sleep.

## 2014-03-23 NOTE — BH Assessment (Addendum)
Tele Assessment Note   Erik Vargas is an 34 y.o. male brought to ED after voicing SI while hanging around [place]. Per nurse's note pt sts he was supposed to get help from DSS today to find a place to live, and they did not help him which caused him to feel suicidal. At time of assessment pt appeared to be sleeping when this writer entered room. Pt was very agitated and possibly paranoid. Pt reports he came to ED because "I have no idea, I felt like I was about to fall out." Pt reports he thinks it crazy that this writer would ask him all these, "those questions are people who don't know what they are doing I know what I am doing." Pt gave very limited information, refused to answer most of the questions asked, and reported he wanted this writer to open the door so he could feel safe, and that he thought this writer was trying to be funny, and he did no think it was funny. Attempted to ask pt questions in a neutral tone, reflect his frustration, and provide information as to the purpose of assessment. Pt became increasingly agitated. Pt was oriented times three and reports he has no idea of the date, he refused to confirm DOB stating it should be on a "A paper somewhere." Pt reports he was feeling suicidal earlier today, noting he was thinking of "biting my skin, nothing serious." He denies previous suicide attempts. Pt reports he self-injures at times but would not supply details. Pt denies HI. Refused to answer questions regarding SA, access to weapons, or AVH. Pt reports he was angry that people kept asking him about his employment status, stating "[place] is not my home town", "there are no jobs for me here", and "I am not trying to stay in [place]." Pt indicated he is trying to get back to his hometown. When asked where that is he replied, "I don't like to talk about it."   Pt refused to answer nutrition screening questions, denies physical pain. Denies struggling with depression and refused  to answer specific questions about possible sx. Pt was previously dx with schizoaffective disorder and per past assessment father had reports pt has a hx of poor medication compliance. Pt reports he currently has no support persons in his life.   Pt denied panic attacks, and refused to answer questions about anxiety. Denied hx of abuse. Reported he felt unsafe with writer in room with the door closed. Opening the door did not appear to calm pt.   Pt refused to answer questions about SA and family hx. Per past records pt has hx of abusing cannabis. Update, after assessment pt's labs resulted and he tested positive for cocaine, THC, and had a BAL of 32.   Pt refused to provide answer about whether or not he could contract for safety.   Axis I: 295.70 Schizoaffective Disorder, per history   Rule out Cannabis Use Disorder  Rule out Cocaine Use Disorder Axis II: Deferred Axis III:  Past Medical History  Diagnosis Date  . Seizures   . Depression   . Schizophrenia   . Bipolar 1 disorder   . Hypertension    Axis IV: economic problems, occupational problems, other psychosocial or environmental problems, problems related to social environment, problems with access to health care services and problems with primary support group Axis V: 41-50 serious symptoms  Past Medical History:  Past Medical History  Diagnosis Date  . Seizures   . Depression   .  Schizophrenia   . Bipolar 1 disorder   . Hypertension     No past surgical history on file.  Family History: No family history on file.  Social History:  reports that he has been smoking Cigarettes.  He has a 10 pack-year smoking history. He does not have any smokeless tobacco history on file. He reports that he drinks about 33.6 oz of alcohol per week. He reports that he uses illicit drugs (Marijuana, IV, and "Crack" cocaine).  Additional Social History:  Alcohol / Drug Use Pain Medications: SEE PTA Prescriptions: SEE PTA, per records hx of  non-complaince Over the Counter: SEE PTA History of alcohol / drug use?: Yes (Per records pt was previously dx with cannabis use disorder, pt refused to give current history ) Longest period of sobriety (when/how long): unknown Negative Consequences of Use:  (unknown) Withdrawal Symptoms:  (unknown) Substance #1 Name of Substance 1: Cannabis [facility] of cannabis use  CIWA: CIWA-Ar BP: 122/66 mmHg Pulse Rate: 94 COWS:    PATIENT STRENGTHS: (choose at least two) Communication skills General fund of knowledge  Allergies: No Known Allergies  Home Medications:  (Not in a hospital admission)  OB/GYN Status:  No LMP for male patient.  General Assessment Data Location of Assessment: [facility] ED Is this a Tele or Face-to-Face Assessment?: Face-to-Face Is this an Initial Assessment or a Re-assessment for this encounter?: Initial Assessment Living Arrangements: Other (Comment) (currently homeless per nurse note, pt refused to answer) Can pt return to current living arrangement?: Yes Admission Status: Voluntary Is patient capable of signing voluntary admission?: Yes Transfer from: Other (Comment) [facility] Referral Source:  [facility] )     [facility] Living Arrangements: Other (Comment) (currently homeless per nurse note, pt refused to answer) Name of Psychiatrist: unknown Name of Therapist: unknown  Education Status Is patient currently in school?: No Current Grade: NA Highest grade of school patient has completed: NA Name of school: NA Contact person: NA  Risk to self with the past 6 months Suicidal Ideation: Yes-Currently Present Suicidal Intent:  (refused to answer) Is patient at risk for suicide?: Yes Suicidal Plan?: No Access to Means: No (pt reprots he was thinking of biting his skin ) What has been your use of drugs/alcohol within the last 12 months?: pt refused to provide information  Previous Attempts/Gestures: No How many  times?: 0 (pt denied hx of suicide attempts) Other Self Harm Risks: none Triggers for Past Attempts: Unknown Intentional Self Injurious Behavior:  (pt reports he self-injures but would not specifiy ) Family Suicide History: Unknown Recent stressful life event(s):  ("nothing going on in my life") Persecutory voices/beliefs?: Yes Depression:  (denies) Depression Symptoms:  (denies and refuses to answer specific questions ) Substance abuse history and/or treatment for substance abuse?: Yes Suicide prevention information given to non-admitted patients: Yes  Risk to Others within the past 6 months Homicidal Ideation: No Thoughts of Harm to Others: No Current Homicidal Intent: No Current Homicidal Plan: No Access to Homicidal Means: No Identified Victim: none History of harm to others?: No Assessment of Violence: None Noted Violent Behavior Description: none Does patient have access to weapons?: No Criminal Charges Pending?: No Does patient have a court date: No  Psychosis Hallucinations:  (refused to answer ) Delusions: None noted  Mental Status Report Appear/Hygiene: Disheveled Eye Contact: Poor Motor Activity: Restlessness Speech: Logical/coherent Level of Consciousness: Alert Mood: Irritable, Anxious Affect: Labile Anxiety Level: Severe Thought Processes: Circumstantial Judgement:  Partial Orientation: Person, Place, Time, Situation Obsessive Compulsive Thoughts/Behaviors: None  Cognitive Functioning Concentration: Unable to Assess Memory: Unable to Assess IQ: Average Insight: Unable to Assess Impulse Control: Unable to Assess Appetite:  (refused to answer) Weight Loss:  (unknown) Weight Gain:  (unknown) Sleep: Unable to Assess Total Hours of Sleep:  (unknown) Vegetative Symptoms: None  ADLScreening [facility] Assessment Services) Patient's cognitive ability adequate to safely complete daily activities?: Yes Patient able to express need for assistance with ADLs?:  Yes Independently performs ADLs?: Yes (appropriate for developmental age)  Prior Inpatient Therapy Prior Inpatient Therapy: Yes Prior Therapy Dates: multiple Prior Therapy Facilty/Provider(s): [facility], [place], [facility] per record review Reason for Treatment: SI, schizoaffective disorder  Prior Outpatient Therapy Prior Outpatient Therapy: Yes Prior Therapy Dates: unknown Prior Therapy Facilty/Provider(s): unknown  Reason for Treatment: medication mangement per past records  ADL Screening (condition at time of admission) Patient's cognitive ability adequate to safely complete daily activities?: Yes Is the patient deaf or have difficulty hearing?: No Does the patient have difficulty seeing, even when wearing glasses/contacts?: No Does the patient have difficulty concentrating, remembering, or making decisions?: No Patient able to express need for assistance with ADLs?: Yes Does the patient have difficulty dressing or bathing?: No Independently performs ADLs?: Yes (appropriate for developmental age) Does the patient have difficulty walking or climbing stairs?: No Weakness of Legs: None Weakness of Arms/Hands: None  Home Assistive Devices/Equipment Home Assistive Devices/Equipment: None    Abuse/Neglect Assessment (Assessment to be complete while patient is alone) Physical Abuse: Denies Verbal Abuse: Denies Sexual Abuse: Denies Exploitation of patient/patient's resources: Denies Self-Neglect: Denies Values / Beliefs Cultural Requests During Hospitalization: None Spiritual Requests During Hospitalization: None   Advance Directives (For Healthcare) Does patient have an advance directive?: No Would patient like information on creating an advanced directive?: No - patient declined information    Additional Information 1:1 In Past 12 Months?: No CIRT Risk: Yes Elopement Risk: No Does patient have medical clearance?: No (labs pending )     Disposition:  Per Donell Sievert, PA am psychiatric evaluation to determine final disposition. Informed Kelly, PA of recommendations and she is in agreement. Informed RN of plan.   Clista Bernhardt, [facility] Triage Specialist 03/23/2014 4:22 AM  Disposition Initial Assessment Completed for this Encounter: Yes  Curtez Brallier M 03/23/2014 4:11 AM

## 2014-03-24 DIAGNOSIS — F1420 Cocaine dependence, uncomplicated: Secondary | ICD-10-CM | POA: Diagnosis not present

## 2014-03-24 DIAGNOSIS — Z5900 Homelessness unspecified: Secondary | ICD-10-CM | POA: Insufficient documentation

## 2014-03-24 DIAGNOSIS — R45851 Suicidal ideations: Secondary | ICD-10-CM | POA: Insufficient documentation

## 2014-03-24 DIAGNOSIS — F250 Schizoaffective disorder, bipolar type: Secondary | ICD-10-CM | POA: Diagnosis not present

## 2014-03-24 NOTE — Progress Notes (Signed)
  CARE MANAGEMENT ED NOTE 03/24/2014  [facility]  [name]   Account Number:  [id number]  Date Initiated:  03/24/2014  Documentation initiated by:  [name],KIMBERLY  Subjective/Objective Assessment:   34 yr old medicaid [place] pt dx Schizoaffective disorder, bipolar type pmh of same plus cocaine use, adjustment disorder with mixed disturbance of emotions and conduct     Subjective/Objective Assessment Detail:   no pcp listed in EPIC  CM noted food tray outside pt door in floor, water in floor in room, pt with blanket over his head in bed CM called pt name x 3 before he responded and pulled cover from over right eye only to acknowledge Cm,  Security in hallway (states pt had been acting out prior to CM arrival)  Pt informed CM he did not "need anything" after CM spoke with him about importance of pcp f/u Pt sniffling but no tears noted CM offered words of encouragement Pt did not respond     Action/Plan:   noted no pcp CM spoke with pt about pcp confirmed no pcp Left pt with [place] of [place] providers CM left resources on counter and told pt she hoped he feels better   Action/Plan Detail:   Anticipated DC Date:  03/24/2014     Status Recommendation to Physician:   Result of Recommendation:    Other ED Services  Consult Working Plan    DC Planning Services  Other  Outpatient Services - Pt will follow up  PCP issues    Choice offered to / List presented to:            Status of service:  Completed, signed off  ED Comments:   ED Comments Detail:

## 2014-03-24 NOTE — BH Assessment (Signed)
[facility] Assessment Progress Note  Per Thedore MinsMojeed Akintayo, MD this pt does not require psychiatric hospitalization at this time and [facility]  However, [place] [facility], and they must first be contacted.  Pt's assigned DSS worker, Sander RadonHeather Hicks, is reportedly currently out of the office, and Roxana HiresMichelle Juchatz [phone]) is covering for her.  The guardianship supervisor, Toya Smothersracy Saunders [phone] can also be contacted.  At 12:20 calls were placed to both these individuals, in both cases rolling to voice mail.  Messages were left with my name and callback number [phone]).  Subsequent calls have also been placed without a message being left.  As of this writing I am awaiting a return call.  Doylene Canninghomas Kerah Hardebeck, MA Triage Specialist 03/24/2014 @ 13:32

## 2014-03-24 NOTE — ED Notes (Signed)
D: Pt denies SI/HI/AV. Pt is pleasant and cooperative. Pt rates depression at a 4, anxiety at a 2, and Helplessness/hopelessness at a 3.  A: Pt was offered support and encouragement. Pt was given scheduled medications. Pt  Q 15 minute checks were done for safety.  R:Pt interacts well with peers and staff. Pt taking medication. Pt has no complaints. Pt receptive to treatment and safety maintained on unit.

## 2014-03-24 NOTE — ED Notes (Signed)
Writer attempted to medicated patient with tylenol 650 mg for temperature of 100.8 oral patient refused. Patient reports " i"m not taking any medication until I'm release". Writer explain to patient that he needed to take medication for temperature because it may cause a delay in his discharge patient continue to refuse. Encouragement and support provided and safety maintain. Q 15 min safety checks remain in place.

## 2014-03-24 NOTE — Progress Notes (Signed)
CSW consulted with nurse who confirms that pt is psychiatrically and medically clear. However, pt does not have any place to go upon discharge. Per note, pt is homeless.  CSW reached out to pt's temporary guardian Marcelino Duster(Michelle [phone]) , due to his usual guardian being on vacation.  However, she did not answer the phone. Also, CSW reached out to DSS supervisor Tyron Russell(Diane Haden [phone]) .  CSW will continue to reach out to pt's DSS contacts. Also, CSW will start the process of looking into the potential placements for pt.  Trish MageBrittney Brya Simerly, LCSWA [phone] ED CSW 03/24/2014 7:24 PM

## 2014-03-24 NOTE — BH Assessment (Signed)
Seeking placement, [facility] [address]   Laroy Mustard, [facility] Triage Specialist 03/24/2014 4:48 AM
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
## 2014-03-24 NOTE — [facility] Suicide Risk Assessment (Signed)
Suicide Risk Assessment  Discharge Assessment   [facility]   Demographic Factors:  Male and Low socioeconomic status  Total Time spent with patient: 45 minutes  Musculoskeletal: Strength & Muscle Tone: within normal limits Gait & Station: normal Patient leans: N/A  Psychiatric Specialty Exam:     Blood pressure 139/73, pulse 82, temperature 98.6 F (37 C), temperature source Oral, resp. rate 18, height 5\' 9"  (1.753 m), SpO2 100 %.There is no weight on file to calculate BMI.  General Appearance: Casual  Eye Contact::  Good  Speech:  Normal Rate409  Volume:  Normal  Mood:  Euthymic  Affect:  Congruent  Thought Process:  Coherent  Orientation:  Full (Time, Place, and Person)  Thought Content:  WDL  Suicidal Thoughts:  No  Homicidal Thoughts:  No  Memory:  Immediate;   Good Recent;   Good Remote;   Good  Judgement:  Fair  Insight:  Fair  Psychomotor Activity:  Normal  Concentration:  Good  Recall:  Good  Fund of Knowledge:Good  Language: Good  Akathisia:  No  Handed:  Right  AIMS (if indicated):     Assets:  Communication Skills Leisure Time Physical Health Resilience Social Support  Sleep:     Cognition: WNL  ADL's:  Intact      Has this patient used any form of tobacco in the last 30 days? (Cigarettes, Smokeless Tobacco, Cigars, and/or Pipes) Yes, A prescription for an FDA-approved tobacco cessation medication was offered at discharge and the patient refused  Mental Status Per Nursing Assessment::   On Admission:   Suicidal ideations  Current Mental Status by Physician: NA  Loss Factors: NA  Historical Factors: NA  Risk Reduction Factors:   Sense of responsibility to family, Positive social support and Positive therapeutic relationship  Continued Clinical Symptoms:  None  Cognitive Features That Contribute To Risk:  None    Suicide Risk:  Minimal: No identifiable suicidal ideation.  Patients presenting with no risk  factors but with morbid ruminations; may be classified as minimal risk based on the severity of the depressive symptoms  Principal Problem: Schizoaffective disorder, bipolar type Discharge Diagnoses:  Patient Active Problem List   Diagnosis Date Noted  . Schizoaffective disorder, bipolar type [F25.0] 03/23/2014    Priority: High  . Cocaine use disorder, severe, dependence [F14.20] 03/23/2014    Priority: High  . Polysubstance abuse [F19.10] 03/23/2013  . Adjustment disorder with mixed disturbance of emotions and conduct [F43.25] 03/23/2013  . Cocaine addiction [F14.20] 11/22/2012  . Marijuana dependence [F12.20] 11/22/2012  . Alcohol abuse, episodic [F10.10] 11/22/2012  . 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 Schizoaffective schizophrenia [F25.0] 11/22/2012  . Schizophrenia [F20.9] 08/17/2012      Plan Of Care/Follow-up recommendations:  Activity:  as tolerated Diet:  heart healthy diet  Is patient on multiple antipsychotic therapies at discharge:  No   Has Patient had three or more failed trials of antipsychotic monotherapy by history:  No  Recommended Plan for Multiple Antipsychotic Therapies: NA    LORD, JAMISON, PMH-NP 03/24/2014, 11:04 AM

## 2014-03-25 DIAGNOSIS — F1420 Cocaine dependence, uncomplicated: Secondary | ICD-10-CM | POA: Diagnosis not present

## 2014-03-25 DIAGNOSIS — F250 Schizoaffective disorder, bipolar type: Secondary | ICD-10-CM | POA: Diagnosis not present

## 2014-03-25 DIAGNOSIS — Z590 Homelessness: Secondary | ICD-10-CM | POA: Diagnosis not present

## 2014-03-25 NOTE — ED Notes (Signed)
Patient presents irritable and anxious; demanding to go home but easily redirected. Denies any current SI/HI/AVH. States that he is refusing all nursing care and medications because he wants to go home. NAD

## 2014-03-25 NOTE — ED Notes (Signed)
Refused VS.  Reported to RN

## 2014-03-25 NOTE — Consult Note (Signed)
[facility] Face-to-Face Psychiatry Consult   Reason for Consult:  Suicidal ideations Referring Physician:  EDP Patient Identification: Erik Vargas MRN:  [id number] Principal Diagnosis: Schizoaffective disorder, bipolar type Diagnosis:   Patient Active Problem List   Diagnosis Date Noted  . Schizoaffective disorder, bipolar type [F25.0] 03/23/2014    Priority: High  . Cocaine use disorder, severe, dependence [F14.20] 03/23/2014    Priority: High  . Homelessness [Z59.0]   . Suicidal ideations [R45.851]   . Polysubstance abuse [F19.10] 03/23/2013  . Adjustment disorder with mixed disturbance of emotions and conduct [F43.25] 03/23/2013  . Cocaine addiction [F14.20] 11/22/2012  . Marijuana dependence [F12.20] 11/22/2012  . Alcohol abuse, episodic [F10.10] 11/22/2012  . 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 Schizoaffective schizophrenia [F25.0] 11/22/2012  . Schizophrenia [F20.9] 08/17/2012    Total Time spent with patient: 30 minutes  Subjective:   Erik Vargas is a 34 y.o. male patient is stable for discharge.  HPI:  The continues to deny suicidal/homicidal ideations, hallucinations.  He wants to leave the ED but his guardian, DSS, has been unavailable for the past two days along with the office staff.  Multiple calls from various staff have been made with no response.  The patient is not happy about having to stay but is cooperative. HPI Elements:   Location:  generalized. Quality:  acute. Severity:  mild. Timing:  intermittent. Duration:  brief. Context:  drinking and using cocaine.  Past Medical History:  Past Medical History  Diagnosis Date  . Seizures   . Depression   . Schizophrenia   . Bipolar 1 disorder   . Hypertension    No past surgical history on file. Family History: No family history on file. Social History:  History  Alcohol Use  . 33.6 oz/week  . 56 Cans of beer per week    Comment: 2 40oz beers / day     History  Drug Use   . Yes  . Special: Marijuana, IV, "Crack" cocaine    Comment: crack    History   Social History  . Marital Status: Single    Spouse Name: N/A  . Number of Children: N/A  . Years of Education: N/A   Social History Main Topics  . Smoking status: Current Every Day Smoker -- 2.00 packs/day for 5 years    Types: Cigarettes  . Smokeless tobacco: Not on file  . Alcohol Use: 33.6 oz/week    56 Cans of beer per week     Comment: 2 40oz beers / day  . Drug Use: Yes    Special: Marijuana, IV, "Crack" cocaine     Comment: crack  . Sexual Activity: Not on file   Other Topics Concern  . Not on file   Social History Narrative  . No narrative on file   Additional Social History:    Pain Medications: SEE PTA Prescriptions: SEE PTA, per records hx of non-complaince Over the Counter: SEE PTA History of alcohol / drug use?: Yes (Per records pt was previously dx with cannabis use disorder, pt refused to give current history ) Longest period of sobriety (when/how long): unknown Negative Consequences of Use:  (unknown) Withdrawal Symptoms:  (unknown) Name of Substance 1: Cannabis per 2014 discharge summary from [facility] history of cannabis use                   Allergies:  No Known Allergies  Vitals: Blood pressure 110/67, pulse 82, temperature 98.3 F (36.8 C), temperature source  Oral, resp. rate 18, height  (1.753 m), SpO2 95 %.  Risk to Self: Suicidal Ideation: Yes-Currently Present Suicidal Intent:  (refused to answer) Is patient at risk for suicide?: Yes Suicidal Plan?: No Access to Means: No (pt reprots he was thinking of biting his skin ) What has been your use of drugs/alcohol within the last 12 months?: pt refused to provide information  How many times?: 0 (pt denied hx of suicide attempts) Other Self Harm Risks: none Triggers for Past Attempts: Unknown Intentional Self Injurious Behavior:  (pt reports he self-injures but would not specifiy ) Risk to Others:  Homicidal Ideation: No Thoughts of Harm to Others: No Current Homicidal Intent: No Current Homicidal Plan: No Access to Homicidal Means: No Identified Victim: none History of harm to others?: No Assessment of Violence: None Noted Violent Behavior Description: none Does patient have access to weapons?: No Criminal Charges Pending?: No Does patient have a court date: No Prior Inpatient Therapy: Prior Inpatient Therapy: Yes Prior Therapy Dates: multiple Prior Therapy Facilty/Provider(s): [facility], [facility], [facility] per record review Reason for Treatment: SI, schizoaffective disorder Prior Outpatient Therapy: Prior Outpatient Therapy: Yes Prior Therapy Dates: unknown Prior Therapy Facilty/Provider(s): unknown  Reason for Treatment: medication mangement per past records  Current Facility-Administered Medications  Medication Dose Route Frequency Provider Last Rate Last Dose  . acetaminophen (TYLENOL) tablet 650 mg  650 mg Oral Q4H PRN Antony Madura, PA-C   650 mg at 03/23/14 2329  . divalproex (DEPAKOTE) DR tablet 500 mg  500 mg Oral BID PC Mojeed Akintayo   500 mg at 03/24/14 0915  . nicotine (NICODERM CQ - dosed in mg/24 hours) patch 21 mg  21 mg Transdermal Daily Antony Madura, PA-C   21 mg at 03/24/14 0915  . OLANZapine zydis (ZYPREXA) disintegrating tablet 10 mg  10 mg Oral Q8H PRN Mojeed Akintayo      . ondansetron (ZOFRAN) tablet 4 mg  4 mg Oral Q8H PRN Antony Madura, PA-C      . potassium chloride (K-DUR) CR tablet 20 mEq  20 mEq Oral Daily Mojeed Akintayo   Stopped at 03/25/14 1031  . QUEtiapine (SEROQUEL) tablet 100 mg  100 mg Oral QHS Mojeed Akintayo   100 mg at 03/23/14 2144   No current outpatient prescriptions on file.   Musculoskeletal: Strength & Muscle Tone: within normal limits Gait & Station: normal Patient leans: N/A  Psychiatric Specialty Exam:     Blood pressure 139/73, pulse 82, temperature 98.6 F (37 C), temperature source Oral, resp. rate 18, height   (1.753 m), SpO2 100 %.There is no weight on file to calculate BMI.  General Appearance: Casual  Eye Contact::  Good  Speech:  Normal Rate409  Volume:  Normal  Mood:  Euthymic  Affect:  Congruent  Thought Process:  Coherent  Orientation:  Full (Time, Place, and Person)  Thought Content:  WDL  Suicidal Thoughts:  No  Homicidal Thoughts:  No  Memory:  Immediate;   Good Recent;   Good Remote;   Good  Judgement:  Fair  Insight:  Fair  Psychomotor Activity:  Normal  Concentration:  Good  Recall:  Good  Fund of Knowledge:Good  Language: Good  Akathisia:  No  Handed:  Right  AIMS (if indicated):     Assets:  Communication Skills Leisure Time Physical Health Resilience Social Support  Sleep:     Cognition: WNL  ADL's:  Intact    Medical Decision Making: Review of Psycho-Social Stressors (  1), Review or order clinical lab tests (1) and Review of Medication Regimen & Side Effects (2)  Treatment Plan Summary: Daily contact with patient to assess and evaluate symptoms and progress in treatment, Medication management and Plan Discharge home to DSS social worker  Plan:  No evidence of imminent risk to self or others at present.   Disposition:Discharge home to DSS social worker   Nanine MeansLORD, JAMISON,  PMH-NP 03/25/2014 2:03 PM
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
## 2014-03-26 MED ORDER — DIPHENHYDRAMINE HCL 50 MG/ML IJ SOLN: 50.0000 mg | Freq: Once | INTRAMUSCULAR | Status: DC

## 2014-03-26 MED ORDER — ZIPRASIDONE MESYLATE 20 MG IM SOLR: 20.0000 mg | Freq: Once | INTRAMUSCULAR | Status: DC

## 2014-03-26 MED ORDER — QUETIAPINE FUMARATE 25 MG PO TABS: 25.0000 mg | ORAL_TABLET | Freq: Three times a day (TID) | ORAL | Status: DC

## 2014-03-26 MED ORDER — LORAZEPAM 2 MG/ML IJ SOLN: 2.0000 mg | Freq: Once | INTRAMUSCULAR | Status: DC

## 2014-03-26 NOTE — ED Notes (Signed)
Pt refused to let writer take VS, RN notified

## 2014-03-26 NOTE — ED Notes (Signed)
Pt is awake and alert. Meal tray was taken to pt's room and pt threw food on the floor and walls. Will continue to monitor for safety.

## 2014-03-26 NOTE — Consult Note (Signed)
[facility]   Reason for Consult:  Suicidal ideations Referring Physician:  EDP Patient Identification: Erik Vargas MRN:  [id number] Principal Diagnosis: Schizoaffective disorder, bipolar type Diagnosis:   Patient Active Problem List   Diagnosis Date Noted  . Schizoaffective disorder, bipolar type [F25.0] 03/23/2014    Priority: High  . Cocaine use disorder, severe, dependence [F14.20] 03/23/2014    Priority: High  . Homelessness [Z59.0]   . Suicidal ideations [R45.851]   . Polysubstance abuse [F19.10] 03/23/2013  . Adjustment disorder with mixed disturbance of emotions and conduct [F43.25] 03/23/2013  . Cocaine addiction [F14.20] 11/22/2012  . Marijuana dependence [F12.20] 11/22/2012  . Alcohol abuse, episodic [F10.10] 11/22/2012  . 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 Schizoaffective schizophrenia [F25.0] 11/22/2012  . Schizophrenia [F20.9] 08/17/2012    Total Time spent with patient: 30 minutes  Subjective:   Erik Vargas is a 34 y.o. male patient is stable for discharge.  HPI:  The continues to deny suicidal/homicidal ideations, hallucinations.  He does not understand why he cannot leave the ED despite being told that DSS is his guardian.  Erik Vargas becomes upset when he is told this information because "I am a grown ass man!"  He has been throwing his food at times due to being angry over being in the ED waiting but DSS cannot be reached.  PRN medications needed this afternoon due to agitation. HPI Elements:   Location:  generalized. Quality:  acute. Severity:  mild. Timing:  intermittent. Duration:  brief. Context:  drinking and using cocaine.  Past Medical History:  Past Medical History  Diagnosis Date  . Seizures   . Depression   . Schizophrenia   . Bipolar 1 disorder   . Hypertension    No past surgical history on file. Family History: No family history on file. Social History:  History  Alcohol Use  .  33.6 oz/week  . 56 Cans of beer per week    Comment: 2 40oz beers / day     History  Drug Use  . Yes  . Special: Marijuana, IV, "Crack" cocaine    Comment: crack    History   Social History  . Marital Status: Single    Spouse Name: N/A  . Number of Children: N/A  . Years of Education: N/A   Social History Main Topics  . Smoking status: Current Every Day Smoker -- 2.00 packs/day for 5 years    Types: Cigarettes  . Smokeless tobacco: Not on file  . Alcohol Use: 33.6 oz/week    56 Cans of beer per week     Comment: 2 40oz beers / day  . Drug Use: Yes    Special: Marijuana, IV, "Crack" cocaine     Comment: crack  . Sexual Activity: Not on file   Other Topics Concern  . Not on file   Social History Narrative  . No narrative on file   Additional Social History:    Pain Medications: SEE PTA Prescriptions: SEE PTA, per records hx of non-complaince Over the Counter: SEE PTA History of alcohol / drug use?: Yes (Per records pt was previously dx with cannabis use disorder, pt refused to give current history ) Longest period of sobriety (when/how long): unknown Negative Consequences of Use:  (unknown) Withdrawal Symptoms:  (unknown) Name of Substance 1: Cann[facility] use  Allergies:  No Known Allergies  Vitals: Blood pressure 119/46, pulse 87, temperature 98.1 F (36.7 C), temperature source Oral, resp. rate 18, height  (1.753 m), SpO2 100 %.  Risk to Self: Suicidal Ideation: Yes-Currently Present Suicidal Intent:  (refused to answer) Is patient at risk for suicide?: Yes Suicidal Plan?: No Access to Means: No (pt reprots he was thinking of biting his skin ) What has been your use of drugs/alcohol within the last 12 months?: pt refused to provide information  How many times?: 0 (pt denied hx of suicide attempts) Other Self Harm Risks: none Triggers for Past Attempts: Unknown Intentional  Self Injurious Behavior:  (pt reports he self-injures but would not specifiy ) Risk to Others: Homicidal Ideation: No Thoughts of Harm to Others: No Current Homicidal Intent: No Current Homicidal Plan: No Access to Homicidal Means: No Identified Victim: none History of harm to others?: No Assessment of Violence: None Noted Violent Behavior Description: none Does patient have access to weapons?: No Criminal Charges Pending?: No Does patient have a court date: No Prior Inpatient Therapy: Prior Inpatient Therapy: Yes Prior Therapy Dates: multiple Prior Therapy Facilty/Provider(s): [facility], [facility], [facility] per record review Reason for Treatment: SI, schizoaffective disorder Prior Outpatient Therapy: Prior Outpatient Therapy: Yes Prior Therapy Dates: unknown Prior Therapy Facilty/Provider(s): unknown  Reason for Treatment: medication mangement per past records  Current Facility-Administered Medications  Medication Dose Route Frequency Provider Last Rate Last Dose  . acetaminophen (TYLENOL) tablet 650 mg  650 mg Oral Q4H PRN Antony Madura, PA-C   650 mg at 03/23/14 2329  . divalproex (DEPAKOTE) DR tablet 500 mg  500 mg Oral BID PC Mojeed Akintayo   500 mg at 03/25/14 2320  . nicotine (NICODERM CQ - dosed in mg/24 hours) patch 21 mg  21 mg Transdermal Daily Antony Madura, PA-C   Stopped at 03/26/14 0850  . OLANZapine zydis (ZYPREXA) disintegrating tablet 10 mg  10 mg Oral Q8H PRN Mojeed Akintayo      . ondansetron (ZOFRAN) tablet 4 mg  4 mg Oral Q8H PRN Antony Madura, PA-C      . potassium chloride (K-DUR) CR tablet 20 mEq  20 mEq Oral Daily Mojeed Akintayo   Stopped at 03/25/14 1031  . QUEtiapine (SEROQUEL) tablet 100 mg  100 mg Oral QHS Mojeed Akintayo   100 mg at 03/25/14 2319   No current outpatient prescriptions on file.   Musculoskeletal: Strength & Muscle Tone: within normal limits Gait & Station: normal Patient leans: N/A  Psychiatric Specialty Exam:     Blood pressure  139/73, pulse 82, temperature 98.6 F (37 C), temperature source Oral, resp. rate 18, height  (1.753 m), SpO2 100 %.There is no weight on file to calculate BMI.  General Appearance: Casual  Eye Contact::  Good  Speech:  Normal Rate  Volume:  Elevated  Mood:  Angry  Affect:  Congruent  Thought Process:  Coherent  Orientation:  Full (Time, Place, and Person)  Thought Content:  WDL  Suicidal Thoughts:  No  Homicidal Thoughts:  No  Memory:  Immediate;   Good Recent;   Good Remote;   Good  Judgement:  Fair  Insight:  Fair  Psychomotor Activity:  Increased  Concentration:  Good  Recall:  Good  Fund of Knowledge:Good  Language: Good  Akathisia:  No  Handed:  Right  AIMS (if indicated):     Assets:  Communication Skills Leisure Time Physical Health Resilience Social Support  Sleep:  Cognition: WNL  ADL's:  Intact    Medical Decision Making: Review of Psycho-Social Stressors (1), Review or order clinical lab tests (1) and Review of Medication Regimen & Side Effects (2)  Treatment Plan Summary: Daily contact with patient to assess and evaluate symptoms and progress in treatment, Medication management and Plan Discharge home to DSS social worker  Plan:  No evidence of imminent risk to self or others at present.   Disposition:Discharge home to DSS social worker   Nanine Means,  PMH-NP 03/26/2014 3:00 PM Patient seen and I agree with assessment and plan  Jamse Belfast.D.

## 2014-03-26 NOTE — ED Notes (Signed)
D Pt. Refuses to speak with Clinical research associatewriter.  Pt. Shakes head when ask if he has any pain. A Writer offered support and encouragement, D Pt. Remains safe on the unit, will continue to monitor to maintain safety.

## 2014-03-26 NOTE — ED Notes (Signed)
Pt is awake and alert. Pt is unwilling to answer questions. Pt is irritable and upset regarding discharge. Pt was evaluated by treatment team and was advised he had to wait for DSS. starting throwing  his food tray on the floor and walls. Will continue to monitor for safey

## 2014-03-27 DIAGNOSIS — F250 Schizoaffective disorder, bipolar type: Secondary | ICD-10-CM

## 2014-03-27 MED ORDER — DIVALPROEX SODIUM 500 MG PO DR TAB: 500.0000 mg | DELAYED_RELEASE_TABLET | Freq: Two times a day (BID) | ORAL | Status: AC

## 2014-03-27 MED ORDER — QUETIAPINE FUMARATE 100 MG PO TABS: 100.0000 mg | ORAL_TABLET | Freq: Every day | ORAL | Status: AC

## 2014-03-27 MED ORDER — QUETIAPINE FUMARATE 25 MG PO TABS: 25.0000 mg | ORAL_TABLET | Freq: Two times a day (BID) | ORAL | Status: AC

## 2014-03-27 NOTE — BH Assessment (Signed)
[facility] Assessment Progress Note  Per Thedore MinsMojeed Akintayo, MD, this [facility]  At 10:24 [facility].  [facility], and a message was left for the ACT Team coordinator on her voice mail.  Doylene Canninghomas Pranish Akhavan, MA Triage Specialist 03/27/2014 @ 10:28

## 2014-03-27 NOTE — Progress Notes (Signed)
CSW spoke with pt guardian, covering DSS worker Roxana HiresMichelle Juchatz who stated patient can follow up with homeless shelters and there is no housing available for patient at this time. [facility]. Pt provided with homeless resources by RN. No further Clinical Social Work needs, signing off.   Olga CoasterKristen Yazhini Mcaulay, LCSW  Clinical Social Work  [facility] [phone]

## 2014-03-27 NOTE — ED Notes (Signed)
Pt is sleeping, VS not done, RN notified

## 2014-03-27 NOTE — [facility] Suicide Risk Assessment (Cosign Needed)
Suicide Risk Assessment  Discharge Assessment   [facility]   Demographic Factors:  Male, Low socioeconomic status, Unemployed and Moving to a Shelter  Total Time spent with patient: 30 minutes  Musculoskeletal: Strength & Muscle Tone: within normal limits Gait & Station: normal Patient leans: N/A  Psychiatric Specialty Exam:     Blood pressure 144/80, pulse 89, temperature 98.3 F (36.8 C), temperature source Oral, resp. rate 18, height 5\' 9"  (1.753 m), SpO2 98 %.There is no weight on file to calculate BMI.  General Appearance: Casual  Eye Contact::  Good  Speech:  Clear and Coherent and Normal Rate409  Volume:  Normal  Mood:  Angry  Affect:  Congruent  Thought Process:  Coherent, Goal Directed and Intact  Orientation:  Full (Time, Place, and Person)  Thought Content:  WDL  Suicidal Thoughts:  No  Homicidal Thoughts:  No  Memory:  Immediate;   Good Recent;   Good Remote;   Good  Judgement:  Good  Insight:  Good  Psychomotor Activity:  Normal  Concentration:  Good  Recall:  NA  Fund of Knowledge:Good  Language: Good  Akathisia:  NA  Handed:  Right  AIMS (if indicated):     Assets:  Desire for Improvement Housing  Sleep:     Cognition: WNL  ADL's:  Intact      Has this patient used any form of tobacco in the last 30 days? (Cigarettes, Smokeless Tobacco, Cigars, and/or Pipes) Yes, A prescription for an FDA-approved tobacco cessation medication was offered at discharge and the patient refused  Mental Status Per Nursing Assessment::   On Admission:     Current Mental Status by Physician: NA  Loss Factors: NA  Historical Factors: Prior suicide attempts  Risk Reduction Factors:   Positive social support  Continued Clinical Symptoms:  Depression:   Aggression Alcohol/Substance Abuse/Dependencies  Cognitive Features That Contribute To Risk:  Polarized thinking    Suicide Risk:  Minimal: No identifiable suicidal ideation.   Patients presenting with no risk factors but with morbid ruminations; may be classified as minimal risk based on the severity of the depressive symptoms  Principal Problem: Schizoaffective disorder, bipolar type Discharge Diagnoses:  Patient Active Problem List   Diagnosis Date Noted  . Homelessness [Z59.0]   . Suicidal ideations [R45.851]   . Schizoaffective disorder, bipolar type [F25.0] 03/23/2014  . Cocaine use disorder, severe, dependence [F14.20] 03/23/2014  . Polysubstance abuse [F19.10] 03/23/2013  . Adjustment disorder with mixed disturbance of emotions and conduct [F43.25] 03/23/2013  . Cocaine addiction [F14.20] 11/22/2012  . Marijuana dependence [F12.20] 11/22/2012  . Alcohol abuse, episodic [F10.10] 11/22/2012  . 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 Schizoaffective schizophrenia [F25.0] 11/22/2012  . Schizophrenia [F20.9] 08/17/2012      Plan Of Care/Follow-up recommendations:  Activity:  As tolerated Diet:  Regulate.  Is patient on multiple antipsychotic therapies at discharge:  No   Has Patient had three or more failed trials of antipsychotic monotherapy by history:  No  Recommended Plan for Multiple Antipsychotic Therapies: NA    Burlene Montecalvo C   PMHNP-BC 03/27/2014, 10:25 AM

## 2014-03-27 NOTE — ED Notes (Signed)
Currently resting quietly in bed with eyes closed. Respirations are even and unlabored. No acute distress noted. Safety has been maintained with q15 minute observation. Will continue current POC 

## 2014-03-27 NOTE — Consult Note (Addendum)
[facility] Face-to-Face Psychiatry Consult   Reason for Consult:  Suicidal ideations Referring Physician:  EDP Patient Identification: Erik Vargas MRN:  [id number] Principal Diagnosis: Schizoaffective disorder, bipolar type Diagnosis:   Patient Active Problem List   Diagnosis Date Noted  . Homelessness [Z59.0]   . Suicidal ideations [R45.851]   . Schizoaffective disorder, bipolar type [F25.0] 03/23/2014  . Cocaine use disorder, severe, dependence [F14.20] 03/23/2014  . Polysubstance abuse [F19.10] 03/23/2013  . Adjustment disorder with mixed disturbance of emotions and conduct [F43.25] 03/23/2013  . Cocaine addiction [F14.20] 11/22/2012  . Marijuana dependence [F12.20] 11/22/2012  . Alcohol abuse, episodic [F10.10] 11/22/2012  . 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 Schizoaffective schizophrenia [F25.0] 11/22/2012  . Schizophrenia [F20.9] 08/17/2012    Total Time spent with patient: 30 minutes  Subjective:   Erik Vargas is a 34 y.o. male patient is stable for discharge.  HPI:  The continues to deny suicidal/homicidal ideations, hallucinations.  He does not understand why he cannot leave the ED despite being told that DSS is his guardian.  Arthuro becomes upset when he is told this information because "I am a grown ass man!"  He has been throwing his food at times due to being angry over being in the ED waiting but DSS cannot be reached.  PRN medications needed this afternoon due to agitation.  Reviewed above note and patient is calm and cooperative.  Patient denies  SI/HI/AVH and will be discharged home.  His DSS guardian want patient to be discharged to a Shelter at the moment and patient is ready now to move to a Shelter  HPI Elements:   Location:  generalized. Quality:  acute. Severity:  mild. Timing:  intermittent. Duration:  brief. Context:  drinking and using cocaine.  Past Medical History:  Past Medical History  Diagnosis Date  . Seizures    . Depression   . Schizophrenia   . Bipolar 1 disorder   . Hypertension    No past surgical history on file. Family History: No family history on file. Social History:  History  Alcohol Use  . 33.6 oz/week  . 56 Cans of beer per week    Comment: 2 40oz beers / day     History  Drug Use  . Yes  . Special: Marijuana, IV, "Crack" cocaine    Comment: crack    History   Social History  . Marital Status: Single    Spouse Name: N/A  . Number of Children: N/A  . Years of Education: N/A   Social History Main Topics  . Smoking status: Current Every Day Smoker -- 2.00 packs/day for 5 years    Types: Cigarettes  . Smokeless tobacco: Not on file  . Alcohol Use: 33.6 oz/week    56 Cans of beer per week     Comment: 2 40oz beers / day  . Drug Use: Yes    Special: Marijuana, IV, "Crack" cocaine     Comment: crack  . Sexual Activity: Not on file   Other Topics Concern  . Not on file   Social History Narrative  . No narrative on file   Additional Social History:    Pain Medications: SEE PTA Prescriptions: SEE PTA, per records hx of non-complaince Over the Counter: SEE PTA History of alcohol / drug use?: Yes (Per records pt was previously dx with cannabis use disorder, pt refused to give current history ) Longest period of sobriety (when/how long): unknown Negative Consequences of Use:  (  unknown) Withdrawal Symptoms:  (unknown) Name of Substance 1: Cann[facility]                   Allergies:  No Known Allergies  Vitals: Blood pressure 144/80, pulse 89, temperature 98.3 F (36.8 C), temperature source Oral, resp. rate 18, height 5\' 9"  (1.753 m), SpO2 98 %.  Risk to Self: Suicidal Ideation: Yes-Currently Present Suicidal Intent:  (refused to answer) Is patient at risk for suicide?: Yes Suicidal Plan?: No Access to Means: No (pt reprots he was thinking of biting his skin ) What has been your use of  drugs/alcohol within the last 12 months?: pt refused to provide information  How many times?: 0 (pt denied hx of suicide attempts) Other Self Harm Risks: none Triggers for Past Attempts: Unknown Intentional Self Injurious Behavior:  (pt reports he self-injures but would not specifiy ) Risk to Others: Homicidal Ideation: No Thoughts of Harm to Others: No Current Homicidal Intent: No Current Homicidal Plan: No Access to Homicidal Means: No Identified Victim: none History of harm to others?: No Assessment of Violence: None Noted Violent Behavior Description: none Does patient have access to weapons?: No Criminal Charges Pending?: No Does patient have a court date: No Prior Inpatient Therapy: Prior Inpatient Therapy: Yes Prior Therapy Dates: multiple [facility] Reason for Treatment: SI, schizoaffective disorder Prior Outpatient Therapy: Prior Outpatient Therapy: Yes Prior Therapy Dates: unknown Prior Therapy Facilty/Provider(s): unknown  Reason for Treatment: medication mangement per past records  Current Facility-Administered Medications  Medication Dose Route Frequency Provider Last Rate Last Dose  . acetaminophen (TYLENOL) tablet 650 mg  650 mg Oral Q4H PRN Antony MaduraKelly Humes, PA-C   650 mg at 03/23/14 2329  . diphenhydrAMINE (BENADRYL) injection 50 mg  50 mg Intramuscular Once Charm RingsJamison Y Lord, NP   50 mg at 03/26/14 1713  . divalproex (DEPAKOTE) DR tablet 500 mg  500 mg Oral BID PC Mikie Misner   500 mg at 03/25/14 2320  . LORazepam (ATIVAN) injection 2 mg  2 mg Intramuscular Once Charm RingsJamison Y Lord, NP   2 mg at 03/26/14 1713  . nicotine (NICODERM CQ - dosed in mg/24 hours) patch 21 mg  21 mg Transdermal Daily Antony MaduraKelly Humes, PA-C   Stopped at 03/26/14 0850  . OLANZapine zydis (ZYPREXA) disintegrating tablet 10 mg  10 mg Oral Q8H PRN Aynsley Fleet      . ondansetron (ZOFRAN) tablet 4 mg  4 mg Oral Q8H PRN Antony MaduraKelly Humes,  PA-C      . potassium chloride (K-DUR) CR tablet 20 mEq  20 mEq Oral Daily Hernando Reali   Stopped at 03/25/14 1031  . QUEtiapine (SEROQUEL) tablet 100 mg  100 mg Oral QHS Takelia Urieta   100 mg at 03/25/14 2319  . QUEtiapine (SEROQUEL) tablet 25 mg  25 mg Oral TID Charm RingsJamison Y Lord, NP   25 mg at 03/26/14 1714  . ziprasidone (GEODON) injection 20 mg  20 mg Intramuscular Once Charm RingsJamison Y Lord, NP   20 mg at 03/26/14 1714   No current outpatient prescriptions on file.   Musculoskeletal: Strength & Muscle Tone: within normal limits Gait & Station: normal Patient leans: N/A  Psychiatric Specialty Exam:     Blood pressure 139/73, pulse 82, temperature 98.6 F (37 C), temperature source Oral, resp. rate 18, height 5\' 9"  (1.753 m), SpO2 100 %.There is no weight on file to  calculate BMI.  General Appearance: Casual  Eye Contact::  Good  Speech:  Normal Rate  Volume:  Elevated  Mood:  Angry  Affect:  Congruent  Thought Process:  Coherent  Orientation:  Full (Time, Place, and Person)  Thought Content:  WDL  Suicidal Thoughts:  No  Homicidal Thoughts:  No  Memory:  Immediate;   Good Recent;   Good Remote;   Good  Judgement:  Fair  Insight:  Fair  Psychomotor Activity:  Increased  Concentration:  Good  Recall:  Good  Fund of Knowledge:Good  Language: Good  Akathisia:  No  Handed:  Right  AIMS (if indicated):     Assets:  Communication Skills Leisure Time Physical Health Resilience Social Support  Sleep:     Cognition: WNL  ADL's:  Intact    Medical Decision Making: Established Problem, Stable/Improving (1)  Treatment Plan Summary: Plan Discharge home today and discharge   Plan:  No evidence of imminent risk to self or others at present.   Discharge home Disposition: Discharge home today   Earney Navy,  PMHNP-BC 03/27/2014 10:11 AM Patient seen face-to-face for psychiatric evaluation, chart reviewed and case discussed with the physician extender and developed  treatment plan. Reviewed the information documented and agree with the treatment plan. Thedore Mins, MD

## 2014-04-01 ENCOUNTER — Encounter (HOSPITAL_COMMUNITY): Payer: Self-pay

## 2014-04-01 ENCOUNTER — Emergency Department (HOSPITAL_COMMUNITY)
Admission: EM | Admit: 2014-04-01 | Discharge: 2014-04-01 | Disposition: A | Discharge status: Home / Self Care | Payer: Medicaid Other | Attending: Emergency Medicine | Admitting: Emergency Medicine

## 2014-04-01 DIAGNOSIS — R059 Cough, unspecified: Secondary | ICD-10-CM

## 2014-04-01 DIAGNOSIS — R05 Cough: Secondary | ICD-10-CM | POA: Diagnosis not present

## 2014-04-01 DIAGNOSIS — F319 Bipolar disorder, unspecified: Secondary | ICD-10-CM | POA: Insufficient documentation

## 2014-04-01 DIAGNOSIS — F419 Anxiety disorder, unspecified: Secondary | ICD-10-CM | POA: Insufficient documentation

## 2014-04-01 DIAGNOSIS — Z720 Tobacco use: Secondary | ICD-10-CM | POA: Insufficient documentation

## 2014-04-01 DIAGNOSIS — F209 Schizophrenia, unspecified: Secondary | ICD-10-CM | POA: Diagnosis not present

## 2014-04-01 DIAGNOSIS — Z79899 Other long term (current) drug therapy: Secondary | ICD-10-CM | POA: Diagnosis not present

## 2014-04-01 DIAGNOSIS — G40909 Epilepsy, unspecified, not intractable, without status epilepticus: Secondary | ICD-10-CM | POA: Diagnosis not present

## 2014-04-01 DIAGNOSIS — I10 Essential (primary) hypertension: Secondary | ICD-10-CM | POA: Insufficient documentation

## 2014-04-01 DIAGNOSIS — Z590 Homelessness: Secondary | ICD-10-CM | POA: Insufficient documentation

## 2014-04-01 DIAGNOSIS — K920 Hematemesis: Secondary | ICD-10-CM | POA: Diagnosis present

## 2014-04-01 NOTE — ED Notes (Signed)
MD stated he wanted to let the patient rest as the patient requested to see if it made him feel better. Will not draw labs until they're ordered.

## 2014-04-01 NOTE — Discharge Instructions (Signed)
Cough, Adult   A cough is a reflex. It helps you clear your throat and airways. A cough can help heal your body. A cough can last 2 or 3 weeks (acute) or may last more than 8 weeks (chronic). Some common causes of a cough can include an infection, allergy, or a cold.  HOME CARE  · Only take medicine as told by your doctor.  · If given, take your medicines (antibiotics) as told. Finish them even if you start to feel better.  · Use a cold steam vaporizer or humidifier in your home. This can help loosen thick spit (secretions).  · Sleep so you are almost sitting up (semi-upright). Use pillows to do this. This helps reduce coughing.  · Rest as needed.  · Stop smoking if you smoke.  GET HELP RIGHT AWAY IF:  · You have yellowish-white fluid (pus) in your thick spit.  · Your cough gets worse.  · Your medicine does not reduce coughing, and you are losing sleep.  · You cough up blood.  · You have trouble breathing.  · Your pain gets worse and medicine does not help.  · You have a fever.  MAKE SURE YOU:   · Understand these instructions.  · Will watch your condition.  · Will get help right away if you are not doing well or get worse.  Document Released: 09/12/2010 Document Revised: 05/16/2013 Document Reviewed: 09/12/2010  ExitCare® Patient Information ©2015 [employer]. This information is not intended to replace advice given to you by your health care provider. Make sure you discuss any questions you have with your health care provider.

## 2014-04-01 NOTE — ED Notes (Signed)
Pt very rude in triage.  Would not answer questions and yelling at staff.  Pt states he doesn't just need seen but needs admitted.  Needs "a bed now".  Hasn't slept in 3 days.  Will notify staff.

## 2014-04-01 NOTE — ED Provider Notes (Signed)
CSN: [id number]     Arrival date & time 04/01/14  0715 History   First MD Initiated Contact with Patient 04/01/14 0755     Chief Complaint  Patient presents with  . Hematemesis  . Cough     (Consider location/radiation/quality/duration/timing/severity/associated sxs/prior Treatment) HPI   Daivion A Talent is a 34 y.o. male is a homeless man who presents, requesting "a bed now". He will not specify anything further.  14:35- he complains of "infection", coughing up blood, and needing to stay in the hospital for several days. He is not taking any medications currently. He will not answer any other questions about his recent history, or past history.  Level V Caveat- poor historian   Past Medical History  Diagnosis Date  . Seizures   . Depression   . Schizophrenia   . Bipolar 1 disorder   . Hypertension    History reviewed. No pertinent past surgical history. History reviewed. No pertinent family history. History  Substance Use Topics  . Smoking status: Current Every Day Smoker -- 2.00 packs/day for 5 years    Types: Cigarettes  . Smokeless tobacco: Not on file  . Alcohol Use: 33.6 oz/week    56 Cans of beer per week     Comment: 2 40oz beers / day    Review of Systems  Unable to perform ROS     Allergies  Review of patient's allergies indicates no known allergies.  Home Medications   Prior to Admission medications   Medication Sig Start Date End Date Taking? Authorizing Provider  divalproex (DEPAKOTE) 500 MG DR tablet Take 1 tablet (500 mg total) by mouth 2 (two) times daily. 03/27/14   Earney Navy, NP  QUEtiapine (SEROQUEL) 100 MG tablet Take 1 tablet (100 mg total) by mouth at bedtime. 03/27/14   Earney Navy, NP  QUEtiapine (SEROQUEL) 25 MG tablet Take 1 tablet (25 mg total) by mouth 2 (two) times daily. 03/27/14   Earney Navy, NP   BP 123/71 mmHg  Pulse 80  Temp(Src) 98.1 F (36.7 C) (Oral)  Resp 16  SpO2 98% Physical Exam   Constitutional: He is oriented to person, place, and time. He appears well-developed and well-nourished.  HENT:  Head: Normocephalic and atraumatic.  Right Ear: External ear normal.  Left Ear: External ear normal.  Eyes: Conjunctivae and EOM are normal. Pupils are equal, round, and reactive to light.  Neck: Normal range of motion and phonation normal. Neck supple.  Cardiovascular: Normal rate, regular rhythm and normal heart sounds.   Pulmonary/Chest: Effort normal and breath sounds normal. No respiratory distress. He has no wheezes. He has no rales. He exhibits no tenderness and no bony tenderness.  Abdominal: Soft. There is no tenderness.  Musculoskeletal: Normal range of motion.  Neurological: He is alert and oriented to person, place, and time. No cranial nerve deficit or sensory deficit. He exhibits normal muscle tone. Coordination normal.  Skin: Skin is warm, dry and intact.  Psychiatric: His behavior is normal.  Anxious  Nursing note and vitals reviewed.   ED Course  Procedures (including critical care time)  Clinical evaluation, done at 14:35 after the patient awoke and became conversant. Prior to this, he would not speak or to staff what was wrong, until within the last hour. His has been able to ambulate, been communicative, and not causing any problems.   14:40- patient was offered Tylenol. He refused stating he "doesn't take pills."   Labs Review Labs Reviewed - No  data to display  Imaging Review No results found.   EKG Interpretation None      MDM   Final diagnoses:  None    Nonspecific cough with normal vital signs. Patient is homeless. I suspect that he is attempting to find a place to stay. I doubt serious bacterial infection or impending vascular collapse. Patient is uncooperative.   Nursing Notes Reviewed/ Care Coordinated Applicable Imaging Reviewed Interpretation of Laboratory Data incorporated into ED treatment  The patient appears reasonably  screened and/or stabilized for discharge and I doubt any other medical condition or other [facility] requiring further screening, evaluation, or treatment in the ED at this time prior to discharge.  Plan: Home Medications- none; Home Treatments- rest; return here if the recommended treatment, does not improve the symptoms; Recommended follow up- PCP prn     Mancel BaleElliott Jacques Fife, MD 04/01/14 1446

## 2014-04-01 NOTE — ED Notes (Signed)
Per pt, 3 days vomiting up blood.  Pt states cough.  Hasn't slept in 3 days.  States fever.

## 2014-04-10 ENCOUNTER — Encounter (HOSPITAL_COMMUNITY): Payer: Self-pay | Admitting: Emergency Medicine

## 2014-04-10 ENCOUNTER — Emergency Department (HOSPITAL_COMMUNITY)
Admission: EM | Admit: 2014-04-10 | Discharge: 2014-04-11 | Disposition: A | Discharge status: Home / Self Care | Payer: Medicaid Other | Attending: Emergency Medicine | Admitting: Emergency Medicine

## 2014-04-10 DIAGNOSIS — F329 Major depressive disorder, single episode, unspecified: Secondary | ICD-10-CM | POA: Diagnosis not present

## 2014-04-10 DIAGNOSIS — I10 Essential (primary) hypertension: Secondary | ICD-10-CM | POA: Insufficient documentation

## 2014-04-10 DIAGNOSIS — F1410 Cocaine abuse, uncomplicated: Secondary | ICD-10-CM | POA: Insufficient documentation

## 2014-04-10 DIAGNOSIS — F332 Major depressive disorder, recurrent severe without psychotic features: Secondary | ICD-10-CM | POA: Diagnosis present

## 2014-04-10 DIAGNOSIS — F32A Depression, unspecified: Secondary | ICD-10-CM

## 2014-04-10 DIAGNOSIS — Z79899 Other long term (current) drug therapy: Secondary | ICD-10-CM | POA: Insufficient documentation

## 2014-04-10 DIAGNOSIS — F209 Schizophrenia, unspecified: Secondary | ICD-10-CM | POA: Insufficient documentation

## 2014-04-10 DIAGNOSIS — R45851 Suicidal ideations: Secondary | ICD-10-CM

## 2014-04-10 DIAGNOSIS — F1210 Cannabis abuse, uncomplicated: Secondary | ICD-10-CM | POA: Insufficient documentation

## 2014-04-10 DIAGNOSIS — Z720 Tobacco use: Secondary | ICD-10-CM | POA: Insufficient documentation

## 2014-04-10 DIAGNOSIS — F333 Major depressive disorder, recurrent, severe with psychotic symptoms: Secondary | ICD-10-CM | POA: Diagnosis not present

## 2014-04-10 DIAGNOSIS — F1910 Other psychoactive substance abuse, uncomplicated: Secondary | ICD-10-CM

## 2014-04-10 LAB — RAPID URINE DRUG SCREEN, HOSP PERFORMED
Amphetamines: NOT DETECTED
Barbiturates: NOT DETECTED
Benzodiazepines: NOT DETECTED
Cocaine: POSITIVE — AB
OPIATES: NOT DETECTED
Tetrahydrocannabinol: POSITIVE — AB

## 2014-04-10 LAB — CBC
HCT: 39.6 % (ref 39.0–52.0)
Hemoglobin: 12.7 g/dL — ABNORMAL LOW (ref 13.0–17.0)
MCH: 28 pg (ref 26.0–34.0)
MCHC: 32.1 g/dL (ref 30.0–36.0)
MCV: 87.4 fL (ref 78.0–100.0)
Platelets: 224 10*3/uL (ref 150–400)
RBC: 4.53 MIL/uL (ref 4.22–5.81)
RDW: 13.3 % (ref 11.5–15.5)
WBC: 8.6 10*3/uL (ref 4.0–10.5)

## 2014-04-10 LAB — COMPREHENSIVE METABOLIC PANEL
ALBUMIN: 4.4 g/dL (ref 3.5–5.2)
ALT: 16 U/L (ref 0–53)
AST: 21 U/L (ref 0–37)
Alkaline Phosphatase: 67 U/L (ref 39–117)
Anion gap: 9 (ref 5–15)
BUN: 17 mg/dL (ref 6–23)
CALCIUM: 9.4 mg/dL (ref 8.4–10.5)
CO2: 30 mmol/L (ref 19–32)
Chloride: 101 mmol/L (ref 96–112)
Creatinine, Ser: 0.88 mg/dL (ref 0.50–1.35)
GFR calc Af Amer: >90 mL/min (ref >90)
GFR calc non Af Amer: >90 mL/min (ref >90)
Glucose, Bld: 92 mg/dL (ref 70–99)
Potassium: 3.5 mmol/L (ref 3.5–5.1)
SODIUM: 140 mmol/L (ref 135–145)
Total Bilirubin: 0.4 mg/dL (ref 0.3–1.2)
Total Protein: 7.7 g/dL (ref 6.0–8.3)

## 2014-04-10 LAB — ACETAMINOPHEN LEVEL

## 2014-04-10 LAB — ETHANOL

## 2014-04-10 LAB — SALICYLATE LEVEL

## 2014-04-10 MED ORDER — IBUPROFEN 200 MG PO TABS
600.0000 mg | ORAL_TABLET | Freq: Three times a day (TID) | ORAL | Status: DC | PRN
  Administered 2014-04-10: 600 mg via ORAL
  Filled 2014-04-10: qty 3

## 2014-04-10 MED ORDER — NICOTINE 21 MG/24HR TD PT24
21.0000 mg | MEDICATED_PATCH | Freq: Every day | TRANSDERMAL | Status: DC
  Administered 2014-04-10: 21 mg via TRANSDERMAL
  Filled 2014-04-10: qty 1

## 2014-04-10 MED ORDER — ONDANSETRON HCL 4 MG PO TABS: 4.0000 mg | ORAL_TABLET | Freq: Three times a day (TID) | ORAL | Status: DC | PRN

## 2014-04-10 MED ORDER — ACETAMINOPHEN 325 MG PO TABS: 650.0000 mg | ORAL_TABLET | ORAL | Status: DC | PRN

## 2014-04-10 MED ORDER — ALUM & MAG HYDROXIDE-SIMETH 200-200-20 MG/5ML PO SUSP: 30.0000 mL | ORAL | Status: DC | PRN

## 2014-04-10 NOTE — ED Notes (Signed)
Dr. Loretha StaplerWofford at bedside with patient.

## 2014-04-10 NOTE — ED Notes (Signed)
Pt states he is going through a lot of depression and is feeling suicidal because of it, denies plan. Pt states about 2 months ago he tied a belt around neck at an attempt to kill self.

## 2014-04-10 NOTE — ED Notes (Signed)
Patient received phone call from his mother.

## 2014-04-10 NOTE — BH Assessment (Addendum)
Tele Assessment Note   Erik Vargas is an 34 y.o. male who presents seeking treatment for depression and SI.  Mr Erik Vargas reports he is homeless and has no family support.  He cannot identify any other stressors at this time, but states he is just going through a really hard time with his depression right now.  He reports that he has been having suicidal thoughts for four weeks, but cannot identify a specific trigger.  He reports that today it was his intention to act on these thoughts though he denies having any plans or considering any ways in which he might end his life.  He does report that 2 mos ago he attempted to "stop breathing in my sleep" and reports he tied a belt around his neck.  He reports 3 other previous attempts including swallowing plastic.    Mr Erik Vargas presents with a blunted affect and later became irritable with questioning.  He has minimal eye contact and was restless and rubbing his head constantly throughout the assessment.  He gives short answers and is somewhat evasive to questioning.  He denied any previous hospitalizations, but previous notes indicate hospitalizations at [facility], [facility], and [facility] to which he stated, "oh yeah.  I forgot about that."  He denies HI or AVH, but his previous assessment indicates a history of schizoaffective disorder.  He does not show any obvious indications of responding to internal stimuli.  He reports depression and endorses a weight loss of approximately 15 lbs, increased sleep of around 10 hours per night with regular fatigue, feelings of worthlessness, irritability, decreased concentration and decreased short term memory.   Mr Erik Vargas will be seen by the psychiatry staff at [facility] for further disposition due to evasiveness, being a poor historian, and inability to contract for safety.  Dr Erik Vargas notified.  Axis I: Major Depression, Recurrent severe Axis II: Deferred Axis III:  Past Medical History  Diagnosis  Date  . Seizures   . Depression   . Schizophrenia   . Bipolar 1 disorder   . Hypertension    Axis IV: economic problems, housing problems and problems with primary support group Axis V: 41-50 serious symptoms  Past Medical History:  Past Medical History  Diagnosis Date  . Seizures   . Depression   . Schizophrenia   . Bipolar 1 disorder   . Hypertension     History reviewed. No pertinent past surgical history.  Family History: No family history on file.  Social History:  reports that he has been smoking Cigarettes.  He has a 10 pack-year smoking history. He does not have any smokeless tobacco history on file. He reports that he drinks about 33.6 oz of alcohol per week. He reports that he uses illicit drugs (Marijuana, IV, and "Crack" cocaine).  Additional Social History:  Alcohol / Drug Use History of alcohol / drug use?: No history of alcohol / drug abuse  CIWA: CIWA-Ar BP: 138/98 mmHg Pulse Rate: 91 COWS:    PATIENT STRENGTHS: (choose at least two) General fund of knowledge  Allergies: No Known Allergies  Home Medications:  (Not in a hospital admission)  OB/GYN Status:  No LMP for male patient.  General Assessment Data Location of Assessment: [facility] ED Is this a Tele or Face-to-Face Assessment?: Tele Assessment Is this an Initial Assessment or a Re-assessment for this encounter?: Initial Assessment Living Arrangements: Other (Comment) (homeless) Can pt return to current living arrangement?: Yes Admission Status: Voluntary Is patient capable of signing  voluntary admission?: Yes Transfer from: Home Referral Source: Self/Family/Friend  Medical Screening Exam [facility] Walk[facility]in [facility]) Medical Exam completed: Yes  [facility] Living Arrangements: Other (Comment) (homeless)  Education Status Is patient currently in school?: No Highest grade of school patient has completed: 10  Risk to self with the past 6 months Suicidal Ideation: Yes-Currently  Present Suicidal Intent: No-Not Currently/Within Last 6 Months Is patient at risk for suicide?: Yes Suicidal Plan?: No-Not Currently/Within Last 6 Months Access to Means: Yes (belt) Specify Access to Suicidal Means: belt What has been your use of drugs/alcohol within the last 12 months?: denies Previous Attempts/Gestures: Yes How many times?: 3 (belt, swallowing plastic) Triggers for Past Attempts: Other personal contacts Intentional Self Injurious Behavior: None Family Suicide History: Unknown Recent stressful life event(s): Other (Comment) (homeless, deprssed, no family) Persecutory voices/beliefs?: No Depression: Yes Depression Symptoms: Feeling worthless/self pity, Feeling angry/irritable, Loss of interest in usual pleasures, Tearfulness, Fatigue, Despondent Substance abuse history and/or treatment for substance abuse?: No Suicide prevention information given to non-admitted patients: Yes  Risk to Others within the past 6 months Homicidal Ideation: No Thoughts of Harm to Others: No Current Homicidal Intent: No Current Homicidal Plan: No Access to Homicidal Means: No History of harm to others?: No Assessment of Violence: None Noted Does patient have access to weapons?: No Criminal Charges Pending?: No Does patient have a court date: No  Psychosis Hallucinations: None noted Delusions: None noted  Mental Status Report Appearance/Hygiene: In scrubs Eye Contact: Fair Motor Activity: Freedom of movement, Restlessness Speech: Logical/coherent Level of Consciousness: Alert Mood: Depressed Affect: Depressed, Blunted Anxiety Level: None Thought Processes: Coherent, Relevant Judgement: Unimpaired Orientation: Person, Place, Time, Situation Obsessive Compulsive Thoughts/Behaviors: Minimal  Cognitive Functioning Concentration: Decreased Memory: Recent Impaired, Remote Intact IQ: Average Insight: Fair Impulse Control: Poor Appetite: Poor Weight Loss: 15 Weight Gain:  0 Sleep: Increased Total Hours of Sleep: 10 Vegetative Symptoms: Staying in bed  ADLScreening [facility] Assessment Services) Patient's cognitive ability adequate to safely complete daily activities?: Yes Patient able to express need for assistance with ADLs?: Yes Independently performs ADLs?: Yes (appropriate for developmental age)  Prior Inpatient Therapy Prior Inpatient Therapy: Yes Prior Therapy Dates: multiple Prior Therapy [facility]ilty[facility] [place] [facility] Reason for Treatment: SI  Prior Outpatient Therapy Prior Outpatient Therapy: Yes Prior Therapy Dates: unknown Prior Therapy Facilty/Provider(s): unknown  Reason for Treatment: medication mangement per past records  ADL Screening (condition at time of admission) Patient's cognitive ability adequate to safely complete daily activities?: Yes Patient able to express need for assistance with ADLs?: Yes Independently performs ADLs?: Yes (appropriate for developmental age)       Abuse/Neglect Assessment (Assessment to be complete while patient is alone) Physical Abuse: Denies Verbal Abuse: Denies Sexual Abuse: Denies     Advance Directives (For Healthcare) Does patient have an advance directive?: No Would patient like information on creating an advanced directive?: No - patient declined information    Additional Information 1:1 In Past 12 Months?: No CIRT Risk: No Elopement Risk: No Does patient have medical clearance?: No     Disposition:     Steward RosDavee Lomax, Yury Schaus Marie 04/10/2014 9:26 AM

## 2014-04-10 NOTE — Consult Note (Signed)
[facility] Face-to-Face Psychiatry Consult   Reason for Consult:  Recurrent Major depression, Referring Physician:  EDP Patient Identification: Erik Vargas MRN:  [id number] Principal Diagnosis: Major depressive disorder, recurrent severe without psychotic features Diagnosis:   Patient Active Problem List   Diagnosis Date Noted  . Major depressive disorder, recurrent severe without psychotic features [F33.2] 04/10/2014    Priority: High  . Homelessness [Z59.0]   . Suicidal ideations [R45.851]   . Schizoaffective disorder, bipolar type [F25.0] 03/23/2014  . Cocaine use disorder, severe, dependence [F14.20] 03/23/2014  . Polysubstance abuse [F19.10] 03/23/2013  . Adjustment disorder with mixed disturbance of emotions and conduct [F43.25] 03/23/2013  . Cocaine addiction [F14.20] 11/22/2012  . Marijuana dependence [F12.20] 11/22/2012  . Alcohol abuse, episodic [F10.10] 11/22/2012  . 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 Schizoaffective schizophrenia [F25.0] 11/22/2012  . Schizophrenia [F20.9] 08/17/2012    Total Time spent with patient: 1 hour  Subjective:   Erik Vargas is a 34 y.o. Vargas patient admitted with Major depression, suicidal ideation.  HPI:  Erik Vargas, 34 years old was evaluated for recurrent depression with suicidal ideations with no plans.  Patient reports poor sleep and appetite  And stated that he has lost about 15 LBS since this year.  He is homeless and stated that he was staying a shelter and that his depressive feeling has increased.  He rated his depression 9/10 with 10 being severe depression.  He was discharged from our [facility] 10 days ago.  He was supposed to follow up with [facility] but stated that he has not done so.   He has a diagnosis of Bipolar disorder and Schizophrenia. He, also is non compliant with his medications and treatment.  He stated that he was taking his medications until he ran out last week and stopped.  Patient did not make eye  contact with providers and became a little irritable when asked more questions.  Patient denies HI/AVH.   He look disheveled, emaciated with his affect as flat.  He has been accepted for admission and we will be looking for admission bed.  We will resume his medications while we look for bed.  He has a guardian  With this number in case we need to contact him.  [phone].    HPI Elements:   Location:  MAJOR DEPRESSION, RECURRENT WITH OUT Psychosis, Suicidal ideation. Quality:  severe, insomnia, loos of weight, lack of motivation, medication and treatment non compliance. Severity:  severe. Timing:  ACUTE. Duration:  Chronic mental illness. Context:  Seeking treatment for depression..  Past Medical History:  Past Medical History  Diagnosis Date  . Seizures   . Depression   . Schizophrenia   . Bipolar 1 disorder   . Hypertension    History reviewed. No pertinent past surgical history. Family History: No family history on file. Social History:  History  Alcohol Use  . 33.6 oz/week  . 56 Cans of beer per week    Comment: 2 40oz beers / day     History  Drug Use  . Yes  . Special: Marijuana, IV, "Crack" cocaine    Comment: crack    History   Social History  . Marital Status: Single    Spouse Name: N/A  . Number of Children: N/A  . Years of Education: N/A   Social History Main Topics  . Smoking status: Current Every Day Smoker -- 2.00 packs/day for 5 years    Types: Cigarettes  . Smokeless tobacco: Not on  file  . Alcohol Use: 33.6 oz/week    56 Cans of beer per week     Comment: 2 40oz beers / day  . Drug Use: Yes    Special: Marijuana, IV, "Crack" cocaine     Comment: crack  . Sexual Activity: Not on file   Other Topics Concern  . None   Social History Narrative   Additional Social History:    History of alcohol / drug use?: No history of alcohol / drug abuse                     Allergies:  No Known Allergies  Labs:  Results for orders placed or  performed during the hospital encounter of 04/10/14 (from the past 48 hour(s))  CBC     Status: Abnormal   Collection Time: 04/10/14  8:32 AM  Result Value Ref Range   WBC 8.6 4.0 - 10.5 K/uL   RBC 4.53 4.22 - 5.81 MIL/uL   Hemoglobin 12.7 (L) 13.0 - 17.0 g/dL   HCT 39.6 39.0 - 52.0 %   MCV 87.4 78.0 - 100.0 fL   MCH 28.0 26.0 - 34.0 pg   MCHC 32.1 30.0 - 36.0 g/dL   RDW 13.3 11.5 - 15.5 %   Platelets 224 150 - 400 K/uL  Comprehensive metabolic panel     Status: None   Collection Time: 04/10/14  8:32 AM  Result Value Ref Range   Sodium 140 135 - 145 mmol/L   Potassium 3.5 3.5 - 5.1 mmol/L   Chloride 101 96 - 112 mmol/L   CO2 30 19 - 32 mmol/L   Glucose, Bld 92 70 - 99 mg/dL   BUN 17 6 - 23 mg/dL   Creatinine, Ser 0.88 0.50 - 1.35 mg/dL   Calcium 9.4 8.4 - 10.5 mg/dL   Total Protein 7.7 6.0 - 8.3 g/dL   Albumin 4.4 3.5 - 5.2 g/dL   AST 21 0 - 37 U/L   ALT 16 0 - 53 U/L   Alkaline Phosphatase 67 39 - 117 U/L   Total Bilirubin 0.4 0.3 - 1.2 mg/dL   GFR calc non Af Amer >90 >90 mL/min   GFR calc Af Amer >90 >90 mL/min    Comment: (NOTE) The eGFR has been calculated using the CKD EPI equation. This calculation has not been validated in all clinical situations. eGFR's persistently <90 mL/min signify possible Chronic Kidney Disease.    Anion gap 9 5 - 15  Urine Drug Screen     Status: Abnormal   Collection Time: 04/10/14  8:33 AM  Result Value Ref Range   Opiates NONE DETECTED NONE DETECTED   Cocaine POSITIVE (A) NONE DETECTED   Benzodiazepines NONE DETECTED NONE DETECTED   Amphetamines NONE DETECTED NONE DETECTED   Tetrahydrocannabinol POSITIVE (A) NONE DETECTED   Barbiturates NONE DETECTED NONE DETECTED    Comment:        DRUG SCREEN FOR MEDICAL PURPOSES ONLY.  IF CONFIRMATION IS NEEDED FOR ANY PURPOSE, NOTIFY LAB WITHIN 5 DAYS.        LOWEST DETECTABLE LIMITS FOR URINE DRUG SCREEN Drug Class       Cutoff (ng/mL) Amphetamine      1000 Barbiturate       200 Benzodiazepine   160 Tricyclics       737 Opiates          300 Cocaine          300 THC  50   Acetaminophen level     Status: Abnormal   Collection Time: 04/10/14  8:36 AM  Result Value Ref Range   Acetaminophen (Tylenol), Serum <10.0 (L) 10 - 30 ug/mL    Comment:        THERAPEUTIC CONCENTRATIONS VARY SIGNIFICANTLY. A RANGE OF 10-30 ug/mL MAY BE AN EFFECTIVE CONCENTRATION FOR MANY PATIENTS. HOWEVER, SOME ARE BEST TREATED AT CONCENTRATIONS OUTSIDE THIS RANGE. ACETAMINOPHEN CONCENTRATIONS >150 ug/mL AT 4 HOURS AFTER INGESTION AND >50 ug/mL AT 12 HOURS AFTER INGESTION ARE OFTEN ASSOCIATED WITH TOXIC REACTIONS.   Ethanol (ETOH)     Status: None   Collection Time: 04/10/14  8:36 AM  Result Value Ref Range   Alcohol, Ethyl (B) <5 0 - 9 mg/dL    Comment:        LOWEST DETECTABLE LIMIT FOR SERUM ALCOHOL IS 11 mg/dL FOR MEDICAL PURPOSES ONLY   Salicylate level     Status: None   Collection Time: 04/10/14  8:36 AM  Result Value Ref Range   Salicylate Lvl <9.8 2.8 - 20.0 mg/dL    Vitals: Blood pressure 138/98, pulse 91, temperature 97.8 F (36.6 C), temperature source Oral, resp. rate 20, SpO2 100 %.  Risk to Self: Suicidal Ideation: Yes-Currently Present Suicidal Intent: No-Not Currently/Within Last 6 Months Is patient at risk for suicide?: Yes Suicidal Plan?: No-Not Currently/Within Last 6 Months Access to Means: Yes (belt) Specify Access to Suicidal Means: belt What has been your use of drugs/alcohol within the last 12 months?: denies How many times?: 3 (belt, swallowing plastic) Triggers for Past Attempts: Other personal contacts Intentional Self Injurious Behavior: None Risk to Others: Homicidal Ideation: No Thoughts of Harm to Others: No Current Homicidal Intent: No Current Homicidal Plan: No Access to Homicidal Means: No History of harm to others?: No Assessment of Violence: None Noted Does patient have access to weapons?: No Criminal Charges  Pending?: No Does patient have a court date: No Prior Inpatient Therapy: Prior Inpatient Therapy: Yes Prior Therapy Dates: multiple Prior Therapy Facilty/Provider(s): [facility] [place], [facility] per record review Reason for Treatment: SI Prior Outpatient Therapy: Prior Outpatient Therapy: Yes Prior Therapy Dates: unknown Prior Therapy Facilty/Provider(s): unknown  Reason for Treatment: medication mangement per past records  Current Facility-Administered Medications  Medication Dose Route Frequency Provider Last Rate Last Dose  . acetaminophen (TYLENOL) tablet 650 mg  650 mg Oral Q4H PRN Blanchie Dessert, MD      . alum & mag hydroxide-simeth (MAALOX/MYLANTA) 200-200-20 MG/5ML suspension 30 mL  30 mL Oral PRN Blanchie Dessert, MD      . ibuprofen (ADVIL,MOTRIN) tablet 600 mg  600 mg Oral Q8H PRN Blanchie Dessert, MD   600 mg at 04/10/14 0958  . nicotine (NICODERM CQ - dosed in mg/24 hours) patch 21 mg  21 mg Transdermal Daily Blanchie Dessert, MD   21 mg at 04/10/14 0958  . ondansetron (ZOFRAN) tablet 4 mg  4 mg Oral Q8H PRN Blanchie Dessert, MD       Current Outpatient Prescriptions  Medication Sig Dispense Refill  . FLUoxetine (PROZAC) 20 MG capsule Take 20 mg by mouth daily.    . risperiDONE (RISPERDAL) 1 MG tablet Take 1 mg by mouth 2 (two) times daily.    . divalproex (DEPAKOTE) 500 MG DR tablet Take 1 tablet (500 mg total) by mouth 2 (two) times daily. (Patient not taking: Reported on 04/10/2014) 60 tablet 0  . QUEtiapine (SEROQUEL) 100 MG tablet Take 1 tablet (100 mg total) by  mouth at bedtime. (Patient not taking: Reported on 04/10/2014) 30 tablet 0  . QUEtiapine (SEROQUEL) 25 MG tablet Take 1 tablet (25 mg total) by mouth 2 (two) times daily. (Patient not taking: Reported on 04/10/2014) 30 tablet 0    Musculoskeletal: Strength & Muscle Tone: within normal limits Gait & Station: normal Patient leans: N/A  Psychiatric Specialty Exam:     Blood pressure 138/98, pulse 91,  temperature 97.8 F (36.6 C), temperature source Oral, resp. rate 20, SpO2 100 %.There is no weight on file to calculate BMI.  General Appearance: Casual and Disheveled  Eye Contact::  None  Speech:  Clear and Coherent and Slow  Volume:  Normal  Mood:  Angry, Anxious, Depressed and Irritable  Affect:  Congruent, Depressed and Flat  Thought Process:  Coherent  Orientation:  Full (Time, Place, and Person)  Thought Content:  suicidal  Suicidal Thoughts:  Yes.  with intent/plan  Homicidal Thoughts:  No  Memory:  Immediate;   Good Recent;   Fair Remote;   Fair  Judgement:  Poor  Insight:  Fair  Psychomotor Activity:  Normal  Concentration:  Fair  Recall:  NA  Fund of Knowledge:Fair  Language: Good  Akathisia:  NA  Handed:  Right  AIMS (if indicated):     Assets:  Desire for Improvement Housing  ADL's:  Impaired  Cognition: WNL  Sleep:      Medical Decision Making: Established Problem, Worsening (2) and Review of Medication Regimen & Side Effects (2)  Treatment Plan Summary: Daily contact with patient to assess and evaluate symptoms and progress in treatment, Medication management and Plan Admit, wait for bed  Plan:  Recommend psychiatric Inpatient admission when medically cleared. Disposition: see above  Delfin Gant   PMHNP-BC 04/10/2014 3:43 PM

## 2014-04-10 NOTE — BH Assessment (Signed)
[facility] Assessment Progress Note  The following facilities have been contacted in an effort to place this patient, with results as noted:  Beds available, information faxed, decision pending:  [facility]  Ineligible due to adult Medicaid[facility]  [facility]:  [facility]  Davyon [place], [place] Triage Specialist 04/10/2014 @ 16:21

## 2014-04-10 NOTE — ED Provider Notes (Signed)
CSN: [id number]     Arrival date & time 04/10/14  [id number] History   First MD Initiated Contact with Patient 04/10/14 [phone]     Chief Complaint  Patient presents with  . Depression  . Suicidal  . Medical Clearance     (Consider location/radiation/quality/duration/timing/severity/associated sxs/prior Treatment) Patient is a 34 y.o. male presenting with mental health disorder. The history is provided by the patient.  Mental Health Problem Presenting symptoms: depression and suicidal thoughts   Patient accompanied by: alone. Degree of incapacity (severity):  Severe Onset quality:  Gradual Duration:  4 weeks Timing:  Constant Progression:  Worsening Chronicity:  Recurrent Context: stressful life event   Context comment:  Patient states that he is homeless and sleeping on the street and Amb[employer] in cars. This is causing him a lot of stress that has been making him more depressed Treatment compliance:  Untreated Relieved by:  Nothing Worsened by:  Nothing tried Ineffective treatments:  None tried Associated symptoms: anhedonia and feelings of worthlessness   Risk factors: hx of mental illness and hx of suicide attempts     Past Medical History  Diagnosis Date  . Seizures   . Depression   . Schizophrenia   . Bipolar 1 disorder   . Hypertension    History reviewed. No pertinent past surgical history. No family history on file. History  Substance Use Topics  . Smoking status: Current Every Day Smoker -- 2.00 packs/day for 5 years    Types: Cigarettes  . Smokeless tobacco: Not on file  . Alcohol Use: 33.6 oz/week    56 Cans of beer per week     Comment: 2 40oz beers / day    Review of Systems  Psychiatric/Behavioral: Positive for suicidal ideas.  All other systems reviewed and are negative.     Allergies  Review of patient's allergies indicates no known allergies.  Home Medications   Prior to Admission medications   Medication Sig Start Date End Date Taking?  Authorizing Provider  divalproex (DEPAKOTE) 500 MG DR tablet Take 1 tablet (500 mg total) by mouth 2 (two) times daily. 03/27/14   Earney NavyJosephine C Onuoha, NP  QUEtiapine (SEROQUEL) 100 MG tablet Take 1 tablet (100 mg total) by mouth at bedtime. 03/27/14   Earney NavyJosephine C Onuoha, NP  QUEtiapine (SEROQUEL) 25 MG tablet Take 1 tablet (25 mg total) by mouth 2 (two) times daily. 03/27/14   Earney NavyJosephine C Onuoha, NP   BP 138/98 mmHg  Pulse 91  Temp(Src) 97.8 F (36.6 C) (Oral)  Resp 20  SpO2 100% Physical Exam  Constitutional: He is oriented to person, place, and time. He appears well-developed and well-nourished. No distress.  Disheveled  HENT:  Head: Normocephalic and atraumatic.  Mouth/Throat: Oropharynx is clear and moist.  Eyes: Conjunctivae and EOM are normal. Pupils are equal, round, and reactive to light.  Neck: Normal range of motion. Neck supple.  Cardiovascular: Normal rate, regular rhythm and intact distal pulses.   No murmur heard. Pulmonary/Chest: Effort normal and breath sounds normal. No respiratory distress. He has no wheezes. He has no rales.  Abdominal: Soft. He exhibits no distension. There is no tenderness. There is no rebound and no guarding.  Musculoskeletal: Normal range of motion. He exhibits no edema or tenderness.  Neurological: He is alert and oriented to person, place, and time.  Skin: Skin is warm and dry. No rash noted. No erythema.  Psychiatric: His behavior is normal. His affect is blunt. He exhibits a depressed mood. He expresses  suicidal ideation. He expresses no suicidal plans.  Nursing note and vitals reviewed.   ED Course  Procedures (including critical care time) Labs Review Labs Reviewed  ACETAMINOPHEN LEVEL - Abnormal; Notable for the following:    Acetaminophen (Tylenol), Serum <10.0 (*)    All other components within normal limits  CBC - Abnormal; Notable for the following:    Hemoglobin 12.7 (*)    All other components within normal limits  URINE RAPID  DRUG SCREEN (HOSP PERFORMED) - Abnormal; Notable for the following:    Cocaine POSITIVE (*)    Tetrahydrocannabinol POSITIVE (*)    All other components within normal limits  COMPREHENSIVE METABOLIC PANEL  ETHANOL  SALICYLATE LEVEL    Imaging Review No results found.   EKG Interpretation None      MDM   Final diagnoses:  Depression  Polysubstance abuse  Suicidal ideation    Patient is a homeless gentleman who presents today with worsening depression and suicidal thoughts. He does not have an active plan at this time but states he's under a lot of stress because of his homelessness. All of his family lives in [place] and he is here alone. He drinks alcohol and smokes marijuana intermittently but denies heavy use. He states he used to be on a medication for depression but has not been on anything for some time.  Patient denies any overdose.  Medical screening labs pending  9:30 AM Patient positive for cocaine but otherwise labs within normal limits. Patient medically clear  Gwyneth Sprout, MD 04/10/14 [phone]

## 2014-04-10 NOTE — Progress Notes (Signed)
  CARE MANAGEMENT ED NOTE 04/10/2014  [facility]  [name]   Account Number:  [id number]  Date Initiated:  04/10/2014  Documentation initiated by:  [name],[name]  Subjective/Objective Assessment:   34 yr old medicaid of [place] pt with SI but no plan positive UDS for cocaine and marijuana dx major depression, recurrent, severe pmh seizures, depression, schizophrenia, bipolar disorder, HTN     Subjective/Objective Assessment Detail:   pt with no [facility] admissions but 3 ED visits in last 6 months [facility]  Pt continues to tell CM he has not support system.  Discussion in SAPPU progression: Pt's father was prevoius guardian but now he has a DSS guardian [phone] ?? Most of shelters pt is not able to return to --Pt is homeless Pt not able to identify his stressors, having a hard time with depression Recommending inpatient admission Pending facility responses  [facility]  Pt quiet and sleeping most of am but awake and quiet in his room in pm Pt responded to CM inquiries with short polite answers     Action/Plan:   Cm discussed importance of pcps via medicaid, being compliant with appointments, Medicaid transportation to get back and forth to Medical appointments including [facility]   Action/Plan Detail:   Anticipated DC Date:  04/11/2014     Status Recommendation to Physician:   Result of Recommendation:    Other ED Services  Consult Working Plan    DC Planning Services  Other  Outpatient Services - Pt will follow up  PCP issues    Choice offered to / List presented to:            Status of service:  Completed, signed off  ED Comments:   ED Comments Detail:

## 2014-04-11 DIAGNOSIS — F332 Major depressive disorder, recurrent severe without psychotic features: Secondary | ICD-10-CM | POA: Diagnosis not present

## 2014-04-11 NOTE — Consult Note (Signed)
[facility] Face-to-Face Psychiatry Consult   Reason for Consult:  Recurrent Major depression, Referring Physician:  EDP Patient Identification: Erik Vargas MRN:  [id number] Principal Diagnosis: Major depressive disorder, recurrent severe without psychotic features Diagnosis:   Patient Active Problem List   Diagnosis Date Noted  . Major depressive disorder, recurrent severe without psychotic features [F33.2] 04/10/2014  . Suicidal ideation [R45.851]   . Homelessness [Z59.0]   . Suicidal ideations [R45.851]   . Schizoaffective disorder, bipolar type [F25.0] 03/23/2014  . Cocaine use disorder, severe, dependence [F14.20] 03/23/2014  . Polysubstance abuse [F19.10] 03/23/2013  . Adjustment disorder with mixed disturbance of emotions and conduct [F43.25] 03/23/2013  . Cocaine addiction [F14.20] 11/22/2012  . Marijuana dependence [F12.20] 11/22/2012  . Alcohol abuse, episodic [F10.10] 11/22/2012  . 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 Schizoaffective schizophrenia [F25.0] 11/22/2012  . Schizophrenia [F20.9] 08/17/2012    Total Time spent with patient: 10 minutes  Subjective:   Erik Vargas is a 34 y.o. male patient admitted with Major depression, suicidal ideation.  HPI:  [name], 34 years old was evaluated for recurrent depression with suicidal ideations with no plans.  Patient reports poor sleep and appetite  And stated that he has lost about 15 LBS since this year.  He is homeless and stated that he was staying a shelter and that his depressive feeling has increased.  He rated his depression 9/10 with 10 being severe depression.  He was discharged from our [place] 10 days ago.  He was supposed to follow up with [facility] but stated that he has not done so.   He has a diagnosis of Bipolar disorder and Schizophrenia. He, also is non compliant with his medications and treatment.  He stated that he was taking his medications until he ran out last week and stopped.  Patient  did not make eye contact with providers and became a little irritable when asked more questions.  Patient denies HI/AVH.   He look disheveled, emaciated with his affect as flat.  He has been accepted for admission and we will be looking for admission bed.  We will resume his medications while we look for bed.  He has a guardian  With this number in case we need to contact him.  [phone].  04/11/2014:  Erik Vargas maintains he is not suicidal now but because he has no place to live and his SSI check does not come until Friday.   he would likely be suicidal if discharged he says.     HPI Elements:   Location:  MAJOR DEPRESSION, RECURRENT WITH OUT Psychosis, Suicidal ideation. Quality:  severe, insomnia, loos of weight, lack of motivation, medication and treatment non compliance. Severity:  severe. Timing:  ACUTE. Duration:  Chronic mental illness. Context:  Seeking treatment for depression..  Past Medical History:  Past Medical History  Diagnosis Date  . Seizures   . Depression   . Schizophrenia   . Bipolar 1 disorder   . Hypertension    History reviewed. No pertinent past surgical history. Family History: No family history on file. Social History:  History  Alcohol Use  . 33.6 oz/week  . 56 Cans of beer per week    Comment: 2 40oz beers / day     History  Drug Use  . Yes  . Special: Marijuana, IV, "Crack" cocaine    Comment: crack    History   Social History  . Marital Status: Single    Spouse Name: N/A  .  Number of Children: N/A  . Years of Education: N/A   Social History Main Topics  . Smoking status: Current Every Day Smoker -- 2.00 packs/day for 5 years    Types: Cigarettes  . Smokeless tobacco: Not on file  . Alcohol Use: 33.6 oz/week    56 Cans of beer per week     Comment: 2 40oz beers / day  . Drug Use: Yes    Special: Marijuana, IV, "Crack" cocaine     Comment: crack  . Sexual Activity: Not on file   Other Topics Concern  . None   Social History  Narrative   Additional Social History:    History of alcohol / drug use?: No history of alcohol / drug abuse                     Allergies:  No Known Allergies  Labs:  Results for orders placed or performed during the hospital encounter of 04/10/14 (from the past 48 hour(s))  CBC     Status: Abnormal   Collection Time: 04/10/14  8:32 AM  Result Value Ref Range   WBC 8.6 4.0 - 10.5 K/uL   RBC 4.53 4.22 - 5.81 MIL/uL   Hemoglobin 12.7 (L) 13.0 - 17.0 g/dL   HCT 39.6 39.0 - 52.0 %   MCV 87.4 78.0 - 100.0 fL   MCH 28.0 26.0 - 34.0 pg   MCHC 32.1 30.0 - 36.0 g/dL   RDW 13.3 11.5 - 15.5 %   Platelets 224 150 - 400 K/uL  Comprehensive metabolic panel     Status: None   Collection Time: 04/10/14  8:32 AM  Result Value Ref Range   Sodium 140 135 - 145 mmol/L   Potassium 3.5 3.5 - 5.1 mmol/L   Chloride 101 96 - 112 mmol/L   CO2 30 19 - 32 mmol/L   Glucose, Bld 92 70 - 99 mg/dL   BUN 17 6 - 23 mg/dL   Creatinine, Ser 0.88 0.50 - 1.35 mg/dL   Calcium 9.4 8.4 - 10.5 mg/dL   Total Protein 7.7 6.0 - 8.3 g/dL   Albumin 4.4 3.5 - 5.2 g/dL   AST 21 0 - 37 U/L   ALT 16 0 - 53 U/L   Alkaline Phosphatase 67 39 - 117 U/L   Total Bilirubin 0.4 0.3 - 1.2 mg/dL   GFR calc non Af Amer >90 >90 mL/min   GFR calc Af Amer >90 >90 mL/min    Comment: (NOTE) The eGFR has been calculated using the CKD EPI equation. This calculation has not been validated in all clinical situations. eGFR's persistently <90 mL/min signify possible Chronic Kidney Disease.    Anion gap 9 5 - 15  Urine Drug Screen     Status: Abnormal   Collection Time: 04/10/14  8:33 AM  Result Value Ref Range   Opiates NONE DETECTED NONE DETECTED   Cocaine POSITIVE (A) NONE DETECTED   Benzodiazepines NONE DETECTED NONE DETECTED   Amphetamines NONE DETECTED NONE DETECTED   Tetrahydrocannabinol POSITIVE (A) NONE DETECTED   Barbiturates NONE DETECTED NONE DETECTED    Comment:        DRUG SCREEN FOR MEDICAL PURPOSES ONLY.   IF CONFIRMATION IS NEEDED FOR ANY PURPOSE, NOTIFY LAB WITHIN 5 DAYS.        LOWEST DETECTABLE LIMITS FOR URINE DRUG SCREEN Drug Class       Cutoff (ng/mL) Amphetamine      1000 Barbiturate  200 Benzodiazepine   440 Tricyclics       102 Opiates          300 Cocaine          300 THC              50   Acetaminophen level     Status: Abnormal   Collection Time: 04/10/14  8:36 AM  Result Value Ref Range   Acetaminophen (Tylenol), Serum <10.0 (L) 10 - 30 ug/mL    Comment:        THERAPEUTIC CONCENTRATIONS VARY SIGNIFICANTLY. A RANGE OF 10-30 ug/mL MAY BE AN EFFECTIVE CONCENTRATION FOR MANY PATIENTS. HOWEVER, SOME ARE BEST TREATED AT CONCENTRATIONS OUTSIDE THIS RANGE. ACETAMINOPHEN CONCENTRATIONS >150 ug/mL AT 4 HOURS AFTER INGESTION AND >50 ug/mL AT 12 HOURS AFTER INGESTION ARE OFTEN ASSOCIATED WITH TOXIC REACTIONS.   Ethanol (ETOH)     Status: None   Collection Time: 04/10/14  8:36 AM  Result Value Ref Range   Alcohol, Ethyl (B) <5 0 - 9 mg/dL    Comment:        LOWEST DETECTABLE LIMIT FOR SERUM ALCOHOL IS 11 mg/dL FOR MEDICAL PURPOSES ONLY   Salicylate level     Status: None   Collection Time: 04/10/14  8:36 AM  Result Value Ref Range   Salicylate Lvl <7.2 2.8 - 20.0 mg/dL    Vitals: Blood pressure 151/72, pulse 72, temperature 97.8 F (36.6 C), temperature source Oral, resp. rate 20, SpO2 99 %.  Risk to Self: Suicidal Ideation: Yes-Currently Present Suicidal Intent: No-Not Currently/Within Last 6 Months Is patient at risk for suicide?: Yes Suicidal Plan?: No-Not Currently/Within Last 6 Months Access to Means: Yes (belt) Specify Access to Suicidal Means: belt What has been your use of drugs/alcohol within the last 12 months?: denies How many times?: 3 (belt, swallowing plastic) Triggers for Past Attempts: Other personal contacts Intentional Self Injurious Behavior: None Risk to Others: Homicidal Ideation: No Thoughts of Harm to Others: No Current  Homicidal Intent: No Current Homicidal Plan: No Access to Homicidal Means: No History of harm to others?: No Assessment of Violence: None Noted Does patient have access to weapons?: No Criminal Charges Pending?: No Does patient have a court date: No Prior Inpatient Therapy: Prior Inpatient Therapy: Yes Prior Therapy Dates: multiple Prior Therapy Facilty/Provider(s): [facility], [place], [facility] per record review Reason for Treatment: SI Prior Outpatient Therapy: Prior Outpatient Therapy: Yes Prior Therapy Dates: unknown Prior Therapy Facilty/Provider(s): unknown  Reason for Treatment: medication mangement per past records  Current Facility-Administered Medications  Medication Dose Route Frequency Provider Last Rate Last Dose  . acetaminophen (TYLENOL) tablet 650 mg  650 mg Oral Q4H PRN Blanchie Dessert, MD      . alum & mag hydroxide-simeth (MAALOX/MYLANTA) 200-200-20 MG/5ML suspension 30 mL  30 mL Oral PRN Blanchie Dessert, MD      . ibuprofen (ADVIL,MOTRIN) tablet 600 mg  600 mg Oral Q8H PRN Blanchie Dessert, MD   600 mg at 04/10/14 0958  . nicotine (NICODERM CQ - dosed in mg/24 hours) patch 21 mg  21 mg Transdermal Daily Blanchie Dessert, MD   21 mg at 04/10/14 0958  . ondansetron (ZOFRAN) tablet 4 mg  4 mg Oral Q8H PRN Blanchie Dessert, MD       Current Outpatient Prescriptions  Medication Sig Dispense Refill  . FLUoxetine (PROZAC) 20 MG capsule Take 20 mg by mouth daily.    . risperiDONE (RISPERDAL) 1 MG tablet Take 1 mg by mouth  2 (two) times daily.    . divalproex (DEPAKOTE) 500 MG DR tablet Take 1 tablet (500 mg total) by mouth 2 (two) times daily. (Patient not taking: Reported on 04/10/2014) 60 tablet 0  . QUEtiapine (SEROQUEL) 100 MG tablet Take 1 tablet (100 mg total) by mouth at bedtime. (Patient not taking: Reported on 04/10/2014) 30 tablet 0  . QUEtiapine (SEROQUEL) 25 MG tablet Take 1 tablet (25 mg total) by mouth 2 (two) times daily. (Patient not taking: Reported on  04/10/2014) 30 tablet 0    Musculoskeletal: Strength & Muscle Tone: within normal limits Gait & Station: normal Patient leans: N/A  Psychiatric Specialty Exam:     Blood pressure 151/72, pulse 72, temperature 97.8 F (36.6 C), temperature source Oral, resp. rate 20, SpO2 99 %.There is no weight on file to calculate BMI.  General Appearance: Casual and Disheveled  Eye Contact::  good  Speech:  Clear and coherent  Volume:  Normal  Mood:  depressed  Affect:  Congruent, Depressed and Flat  Thought Process:  Coherent  Orientation:  Full (Time, Place, and Person)  Thought Content:  suicidal  Suicidal Thoughts:  No as long as he is not discharged to the street  Homicidal Thoughts:  No  Memory:  Immediate;   Good Recent;   Fair Remote;   Fair  Judgement:  Poor  Insight:  Fair  Psychomotor Activity:  Normal  Concentration:  Fair  Recall:  NA  Fund of Knowledge:Fair  Language: Good  Akathisia:  NA  Handed:  Right  AIMS (if indicated):     Assets:  Desire for Improvement Housing  ADL's:  Impaired  Cognition: WNL  Sleep:      Medical Decision Making: talked to patient, consulted with social work  Treatment Plan Summary: Daily contact with patient to assess and evaluate symptoms and progress in treatment, Medication management and Plan Admit, wait for bed  Plan:  Recommend psychiatric Inpatient admission when medically cleared. Disposition: see above  Clarene Reamer   PMHNP-BC 04/11/2014 11:35 AM

## 2014-04-11 NOTE — ED Notes (Signed)
Chaplain Family Dollar StoresMatt Stalnaker brought pt a burgundy shirt and dark green pants to wear on discharge.

## 2014-04-11 NOTE — ED Notes (Signed)
Pt discharged to lobby, escorted by [facility]. Given AVS, told to be at [facility] at 11 a.m. Tomorrow, given shirt/pants and belongings from locker. Signed for belongings and for discharged. In no acute distress and denying pain.

## 2014-04-11 NOTE — Progress Notes (Signed)
CSW spoke with Erik RadonHeather Vargas patient guardian. Per guardian, this is recurrent behavior. Per guardian, patient has a $400 SSi check, DSS is payee. Patient was recently staying at a motel but tore the room up. [facility] shelters [facility]. Per guardian, patient claims SI when he has no where else to go and has no money. Per guardian patient only follows up with DSS when check is coming. Per guardian, patient has refused actt teams in the past and will vanish.  Per guardian, Pt family does hot have any connection with patient due to similar behavior.   Olga CoasterKristen Sol Englert, LCSW  Clinical Social Work  [facility] [phone]

## 2014-04-11 NOTE — Consult Note (Signed)
[facility] Face-to-Face Psychiatry Consult   Reason for Consult:  Recurrent Major depression, Referring Physician:  EDP Patient Identification: Erik Vargas MRN:  [id number] Principal Diagnosis: Major depressive disorder, recurrent severe without psychotic features Diagnosis:   Patient Active Problem List   Diagnosis Date Noted  . Major depressive disorder, recurrent severe without psychotic features [F33.2] 04/10/2014  . Suicidal ideation [R45.851]   . Homelessness [Z59.0]   . Suicidal ideations [R45.851]   . Schizoaffective disorder, bipolar type [F25.0] 03/23/2014  . Cocaine use disorder, severe, dependence [F14.20] 03/23/2014  . Polysubstance abuse [F19.10] 03/23/2013  . Adjustment disorder with mixed disturbance of emotions and conduct [F43.25] 03/23/2013  . Cocaine addiction [F14.20] 11/22/2012  . Marijuana dependence [F12.20] 11/22/2012  . Alcohol abuse, episodic [F10.10] 11/22/2012  . 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 Schizoaffective schizophrenia [F25.0] 11/22/2012  . Schizophrenia [F20.9] 08/17/2012    Total Time spent with patient: 10 minutes  Subjective:   Erik Vargas is a 34 y.o. male patient admitted with Major depression, suicidal ideation.  HPI:  [name], 34 years old was evaluated for recurrent depression with suicidal ideations with no plans.  Patient reports poor sleep and appetite  And stated that he has lost about 15 LBS since this year.  He is homeless and stated that he was staying a shelter and that his depressive feeling has increased.  He rated his depression 9/10 with 10 being severe depression.  He was discharged from our [place] 10 days ago.  He was supposed to follow up with [facility] but stated that he has not done so.   He has a diagnosis of Bipolar disorder and Schizophrenia. He, also is non compliant with his medications and treatment.  He stated that he was taking his medications until he ran out last week and stopped.  Patient  did not make eye contact with providers and became a little irritable when asked more questions.  Patient denies HI/AVH.   He look disheveled, emaciated with his affect as flat.  He has been accepted for admission and we will be looking for admission bed.  We will resume his medications while we look for bed.  He has a guardian  With this number in case we need to contact him.  [phone].  04/11/2014:  Erik Vargas maintains he is not suicidal now but because he has no place to live and his SSI check does not come until Friday.   he would likely be suicidal if discharged he says.  04/11/2014:  Erik Vargas says this afternoon that he is no longer suicidal and wants to be discharged.  His guardian says she does not believe he is suicidal and I also do not believe he is suicidal.  He has used those threats for years to get what he wants she says and we have seen that here as well.  He retracts the threats when he is ready to leave.     HPI Elements:   Location:  MAJOR DEPRESSION, RECURRENT WITH OUT Psychosis, Suicidal ideation. Quality:  severe, insomnia, loos of weight, lack of motivation, medication and treatment non compliance. Severity:  severe. Timing:  ACUTE. Duration:  Chronic mental illness. Context:  Seeking treatment for depression..  Past Medical History:  Past Medical History  Diagnosis Date  . Seizures   . Depression   . Schizophrenia   . Bipolar 1 disorder   . Hypertension    History reviewed. No pertinent past surgical history. Family History: No family history on  file. Social History:  History  Alcohol Use  . 33.6 oz/week  . 56 Cans of beer per week    Comment: 2 40oz beers / day     History  Drug Use  . Yes  . Special: Marijuana, IV, "Crack" cocaine    Comment: crack    History   Social History  . Marital Status: Single    Spouse Name: N/A  . Number of Children: N/A  . Years of Education: N/A   Social History Main Topics  . Smoking status: Current Every  Day Smoker -- 2.00 packs/day for 5 years    Types: Cigarettes  . Smokeless tobacco: Not on file  . Alcohol Use: 33.6 oz/week    56 Cans of beer per week     Comment: 2 40oz beers / day  . Drug Use: Yes    Special: Marijuana, IV, "Crack" cocaine     Comment: crack  . Sexual Activity: Not on file   Other Topics Concern  . None   Social History Narrative   Additional Social History:    History of alcohol / drug use?: No history of alcohol / drug abuse                     Allergies:  No Known Allergies  Labs:  Results for orders placed or performed during the hospital encounter of 04/10/14 (from the past 48 hour(s))  CBC     Status: Abnormal   Collection Time: 04/10/14  8:32 AM  Result Value Ref Range   WBC 8.6 4.0 - 10.5 K/uL   RBC 4.53 4.22 - 5.81 MIL/uL   Hemoglobin 12.7 (L) 13.0 - 17.0 g/dL   HCT 39.6 39.0 - 52.0 %   MCV 87.4 78.0 - 100.0 fL   MCH 28.0 26.0 - 34.0 pg   MCHC 32.1 30.0 - 36.0 g/dL   RDW 13.3 11.5 - 15.5 %   Platelets 224 150 - 400 K/uL  Comprehensive metabolic panel     Status: None   Collection Time: 04/10/14  8:32 AM  Result Value Ref Range   Sodium 140 135 - 145 mmol/L   Potassium 3.5 3.5 - 5.1 mmol/L   Chloride 101 96 - 112 mmol/L   CO2 30 19 - 32 mmol/L   Glucose, Bld 92 70 - 99 mg/dL   BUN 17 6 - 23 mg/dL   Creatinine, Ser 0.88 0.50 - 1.35 mg/dL   Calcium 9.4 8.4 - 10.5 mg/dL   Total Protein 7.7 6.0 - 8.3 g/dL   Albumin 4.4 3.5 - 5.2 g/dL   AST 21 0 - 37 U/L   ALT 16 0 - 53 U/L   Alkaline Phosphatase 67 39 - 117 U/L   Total Bilirubin 0.4 0.3 - 1.2 mg/dL   GFR calc non Af Amer >90 >90 mL/min   GFR calc Af Amer >90 >90 mL/min    Comment: (NOTE) The eGFR has been calculated using the CKD EPI equation. This calculation has not been validated in all clinical situations. eGFR's persistently <90 mL/min signify possible Chronic Kidney Disease.    Anion gap 9 5 - 15  Urine Drug Screen     Status: Abnormal   Collection Time: 04/10/14   8:33 AM  Result Value Ref Range   Opiates NONE DETECTED NONE DETECTED   Cocaine POSITIVE (A) NONE DETECTED   Benzodiazepines NONE DETECTED NONE DETECTED   Amphetamines NONE DETECTED NONE DETECTED   Tetrahydrocannabinol POSITIVE (A) NONE  DETECTED   Barbiturates NONE DETECTED NONE DETECTED    Comment:        DRUG SCREEN FOR MEDICAL PURPOSES ONLY.  IF CONFIRMATION IS NEEDED FOR ANY PURPOSE, NOTIFY LAB WITHIN 5 DAYS.        LOWEST DETECTABLE LIMITS FOR URINE DRUG SCREEN Drug Class       Cutoff (ng/mL) Amphetamine      1000 Barbiturate      200 Benzodiazepine   672 Tricyclics       094 Opiates          300 Cocaine          300 THC              50   Acetaminophen level     Status: Abnormal   Collection Time: 04/10/14  8:36 AM  Result Value Ref Range   Acetaminophen (Tylenol), Serum <10.0 (L) 10 - 30 ug/mL    Comment:        THERAPEUTIC CONCENTRATIONS VARY SIGNIFICANTLY. A RANGE OF 10-30 ug/mL MAY BE AN EFFECTIVE CONCENTRATION FOR MANY PATIENTS. HOWEVER, SOME ARE BEST TREATED AT CONCENTRATIONS OUTSIDE THIS RANGE. ACETAMINOPHEN CONCENTRATIONS >150 ug/mL AT 4 HOURS AFTER INGESTION AND >50 ug/mL AT 12 HOURS AFTER INGESTION ARE OFTEN ASSOCIATED WITH TOXIC REACTIONS.   Ethanol (ETOH)     Status: None   Collection Time: 04/10/14  8:36 AM  Result Value Ref Range   Alcohol, Ethyl (B) <5 0 - 9 mg/dL    Comment:        LOWEST DETECTABLE LIMIT FOR SERUM ALCOHOL IS 11 mg/dL FOR MEDICAL PURPOSES ONLY   Salicylate level     Status: None   Collection Time: 04/10/14  8:36 AM  Result Value Ref Range   Salicylate Lvl <7.0 2.8 - 20.0 mg/dL    Vitals: Blood pressure 151/72, pulse 72, temperature 97.8 F (36.6 C), temperature source Oral, resp. rate 20, SpO2 99 %.  Risk to Self: Suicidal Ideation: Yes-Currently Present Suicidal Intent: No-Not Currently/Within Last 6 Months Is patient at risk for suicide?: Yes Suicidal Plan?: No-Not Currently/Within Last 6 Months Access to Means:  Yes (belt) Specify Access to Suicidal Means: belt What has been your use of drugs/alcohol within the last 12 months?: denies How many times?: 3 (belt, swallowing plastic) Triggers for Past Attempts: Other personal contacts Intentional Self Injurious Behavior: None Risk to Others: Homicidal Ideation: No Thoughts of Harm to Others: No Current Homicidal Intent: No Current Homicidal Plan: No Access to Homicidal Means: No History of harm to others?: No Assessment of Violence: None Noted Does patient have access to weapons?: No Criminal Charges Pending?: No Does patient have a court date: No Prior Inpatient Therapy: Prior Inpatient Therapy: Yes Prior Therapy Dates: multiple Prior Therapy Facilty/Provider(s): [facility], [place], [facility] per record review Reason for Treatment: SI Prior Outpatient Therapy: Prior Outpatient Therapy: Yes Prior Therapy Dates: unknown Prior Therapy Facilty/Provider(s): unknown  Reason for Treatment: medication mangement per past records  Current Facility-Administered Medications  Medication Dose Route Frequency Provider Last Rate Last Dose  . acetaminophen (TYLENOL) tablet 650 mg  650 mg Oral Q4H PRN Blanchie Dessert, MD      . alum & mag hydroxide-simeth (MAALOX/MYLANTA) 200-200-20 MG/5ML suspension 30 mL  30 mL Oral PRN Blanchie Dessert, MD      . ibuprofen (ADVIL,MOTRIN) tablet 600 mg  600 mg Oral Q8H PRN Blanchie Dessert, MD   600 mg at 04/10/14 0958  . nicotine (NICODERM CQ - dosed in mg/24 hours)  patch 21 mg  21 mg Transdermal Daily Blanchie Dessert, MD   21 mg at 04/10/14 0958  . ondansetron (ZOFRAN) tablet 4 mg  4 mg Oral Q8H PRN Blanchie Dessert, MD       Current Outpatient Prescriptions  Medication Sig Dispense Refill  . FLUoxetine (PROZAC) 20 MG capsule Take 20 mg by mouth daily.    . risperiDONE (RISPERDAL) 1 MG tablet Take 1 mg by mouth 2 (two) times daily.    . divalproex (DEPAKOTE) 500 MG DR tablet Take 1 tablet (500 mg total) by mouth 2  (two) times daily. (Patient not taking: Reported on 04/10/2014) 60 tablet 0  . QUEtiapine (SEROQUEL) 100 MG tablet Take 1 tablet (100 mg total) by mouth at bedtime. (Patient not taking: Reported on 04/10/2014) 30 tablet 0  . QUEtiapine (SEROQUEL) 25 MG tablet Take 1 tablet (25 mg total) by mouth 2 (two) times daily. (Patient not taking: Reported on 04/10/2014) 30 tablet 0    Musculoskeletal: Strength & Muscle Tone: within normal limits Gait & Station: normal Patient leans: N/A  Psychiatric Specialty Exam:     Blood pressure 151/72, pulse 72, temperature 97.8 F (36.6 C), temperature source Oral, resp. rate 20, SpO2 99 %.There is no weight on file to calculate BMI.  General Appearance: Casual and Disheveled  Eye Contact::  good  Speech:  Clear and coherent  Volume:  Normal  Mood:  depressed  Affect:  Congruent, Depressed and Flat  Thought Process:  Coherent  Orientation:  Full (Time, Place, and Person)  Thought Content:  suicidal  Suicidal Thoughts:  No as long as he is not discharged to the street  Homicidal Thoughts:  No  Memory:  Immediate;   Good Recent;   Fair Remote;   Fair  Judgement:  Poor  Insight:  Fair  Psychomotor Activity:  Normal  Concentration:  Fair  Recall:  NA  Fund of Knowledge:Fair  Language: Good  Akathisia:  NA  Handed:  Right  AIMS (if indicated):     Assets:  Desire for Improvement Housing  ADL's:  Impaired  Cognition: WNL  Sleep:      Medical Decision Making: talked to patient, consulted with social work  Treatment Plan Summary: Daily contact with patient to assess and evaluate symptoms and progress in treatment, Medication management and Plan Admit, wait for bed  Plan:  Discharge home to be followed outpatient Disposition: see above  Clarene Reamer   PMHNP-BC 04/11/2014 1:49 PM

## 2014-04-11 NOTE — BH Assessment (Signed)
Patient re-evaluated by psychiatry (Dr. Ladona Ridgelaylor). Per Dr. Ladona Ridgelaylor patient no longer voices suicidal ideations. Patient to discharge home.Marland Kitchen. Writer informed patient's nurse Clydie Braun(Karen) of updated discharge plan.

## 2014-04-11 NOTE — [facility] Suicide Risk Assessment (Signed)
[facility]   Demographic Factors:  34 year old male  Total Time spent with patient: 30 minutes  Musculoskeletal: Strength & Muscle Tone: within normal limits Gait & Station: normal Patient leans: N/A  Psychiatric Specialty Exam: Physical Exam  ROS  Blood pressure 151/72, pulse 72, temperature 97.8 F (36.6 C), temperature source Oral, resp. rate 20, SpO2 99 %.There is no weight on file to calculate BMI.  General Appearance: Casual  Eye Contact::  Good  Speech:  Clear and Coherent409  Volume:  Normal  Mood:  Euthymic  Affect:  Appropriate  Thought Process:  Coherent  Orientation:  Full (Time, Place, and Person)  Thought Content:  Negative  Suicidal Thoughts:  No  Homicidal Thoughts:  No  Memory:  Immediate;   Good Recent;   Good Remote;   Good  Judgement:  Fair  Insight:  Lacking  Psychomotor Activity:  Normal  Concentration:  Good  Recall:  Good  Fund of Knowledge:Good  Language: Good  Akathisia:  Negative  Handed:  Right  AIMS (if indicated):     Assets:  Communication Skills  Sleep:     Cognition: WNL  ADL's:  Intact      Has this patient used any form of tobacco in the last 30 days? (Cigarettes, Smokeless Tobacco, Cigars, and/or Pipes) No  Mental Status Per Nursing Assessment::   On Admission:     Current Mental Status by Physician: NA  Loss Factors: NA  Historical Factors: NA  Risk Reduction Factors:   NA  Continued Clinical Symptoms:  Schizophrenia:   Depressive state  Cognitive Features That Contribute To Risk:  Closed-mindedness    Suicide Risk:  Minimal: No identifiable suicidal ideation.  Patients presenting with no risk factors but with morbid ruminations; may be classified as minimal risk based on the severity of the depressive symptoms  Principal Problem: Major depressive disorder, recurrent severe without psychotic features Discharge Diagnoses:  Patient Active Problem List   Diagnosis Date Noted  .  Major depressive disorder, recurrent severe without psychotic features [F33.2] 04/10/2014  . Suicidal ideation [R45.851]   . Homelessness [Z59.0]   . Suicidal ideations [R45.851]   . Schizoaffective disorder, bipolar type [F25.0] 03/23/2014  . Cocaine use disorder, severe, dependence [F14.20] 03/23/2014  . Polysubstance abuse [F19.10] 03/23/2013  . Adjustment disorder with mixed disturbance of emotions and conduct [F43.25] 03/23/2013  . Cocaine addiction [F14.20] 11/22/2012  . Marijuana dependence [F12.20] 11/22/2012  . Alcohol abuse, episodic [F10.10] 11/22/2012  . 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 Schizoaffective schizophrenia [F25.0] 11/22/2012  . Schizophrenia [F20.9] 08/17/2012      Plan Of Care/Follow-up recommendations:  Activity:  resume usual activity Diet:  resume usual diet  Is patient on multiple antipsychotic therapies at discharge:  No   Has Patient had three or more failed trials of antipsychotic monotherapy by history:  No  Recommended Plan for Multiple Antipsychotic Therapies: NA    Baneza Bartoszek D 04/11/2014, 1:56 PM

## 2014-04-11 NOTE — ED Notes (Addendum)
Pt acuity note: Medium Pt irritable upon admission. He demanded to see MD and said he hadn't seen MD yet today (MD had). He said he was told he could keep his earring (chart indicates he refused to give it). He remains in his room and is reluctant to answer questions. [facility] reportedly [facility], and has a history of acting out here.

## 2014-04-11 NOTE — ED Notes (Signed)
MD at bedside. 

## 2014-04-11 NOTE — BH Assessment (Signed)
Dr. Ladona Ridgelaylor and Nanine MeansJamison Lord, DNP recommend inpatient treatment. No [facility] beds available at this time. TTS to seek appropriate placement. Pt referred to the following hospitals: [place], [place], [facility], [facility], [facility], [address], [address], [address], [facility], [facility], [facility], [place], and [facility].

## 2014-05-14 DEATH — deceased
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
